# Patient Record
Sex: Female | Born: 1985
Health system: Southern US, Community
[De-identification: ages and names within clinical notes are randomized; demographics above are authoritative.]

## PROBLEM LIST (undated history)

## (undated) DIAGNOSIS — G4700 Insomnia, unspecified: Secondary | ICD-10-CM

## (undated) DIAGNOSIS — F419 Anxiety disorder, unspecified: Secondary | ICD-10-CM

## (undated) DIAGNOSIS — F909 Attention-deficit hyperactivity disorder, unspecified type: Secondary | ICD-10-CM

## (undated) DIAGNOSIS — F319 Bipolar disorder, unspecified: Secondary | ICD-10-CM

## (undated) HISTORY — PX: COLPOSCOPY: SHX161

## (undated) HISTORY — DX: Anxiety disorder, unspecified: F41.9

## (undated) HISTORY — DX: Bipolar disorder, unspecified: F31.9

## (undated) HISTORY — DX: Attention-deficit hyperactivity disorder, unspecified type: F90.9

## (undated) HISTORY — DX: Insomnia, unspecified: G47.00

---

## 2005-05-25 ENCOUNTER — Emergency Department: Payer: Self-pay | Admitting: Emergency Medicine

## 2005-05-27 ENCOUNTER — Emergency Department: Payer: Self-pay | Admitting: Emergency Medicine

## 2005-06-10 ENCOUNTER — Observation Stay: Payer: Self-pay | Admitting: Obstetrics and Gynecology

## 2005-07-22 ENCOUNTER — Observation Stay: Payer: Self-pay | Admitting: Obstetrics and Gynecology

## 2005-08-26 ENCOUNTER — Observation Stay: Payer: Self-pay | Admitting: Obstetrics and Gynecology

## 2005-08-26 ENCOUNTER — Inpatient Hospital Stay: Payer: Self-pay | Admitting: Obstetrics and Gynecology

## 2007-05-03 ENCOUNTER — Encounter: Payer: Self-pay | Admitting: Maternal & Fetal Medicine

## 2007-07-12 ENCOUNTER — Encounter: Payer: Self-pay | Admitting: Maternal & Fetal Medicine

## 2007-08-12 ENCOUNTER — Observation Stay: Payer: Self-pay | Admitting: Obstetrics and Gynecology

## 2007-08-16 ENCOUNTER — Encounter: Payer: Self-pay | Admitting: Obstetrics and Gynecology

## 2007-08-23 ENCOUNTER — Encounter: Payer: Self-pay | Admitting: Maternal & Fetal Medicine

## 2007-08-30 ENCOUNTER — Encounter: Payer: Self-pay | Admitting: Maternal & Fetal Medicine

## 2007-09-06 ENCOUNTER — Encounter: Payer: Self-pay | Admitting: Obstetrics & Gynecology

## 2007-09-10 ENCOUNTER — Encounter: Payer: Self-pay | Admitting: Obstetrics & Gynecology

## 2007-09-17 ENCOUNTER — Encounter: Payer: Self-pay | Admitting: Maternal & Fetal Medicine

## 2007-09-20 ENCOUNTER — Observation Stay: Payer: Self-pay

## 2007-09-24 ENCOUNTER — Observation Stay: Payer: Self-pay

## 2007-09-26 ENCOUNTER — Inpatient Hospital Stay: Payer: Self-pay | Admitting: Obstetrics and Gynecology

## 2009-02-07 IMAGING — US ULTRAOUND OB LIMITED - NRPT [facility]
1 series · 14 of 28 positions shown · non-contrast
Comparison: none

[Series 1: ultraound ob limited - nrpt [facility] · 0.38mm/px · 14 of 42 slices shown]
[im 2/42]
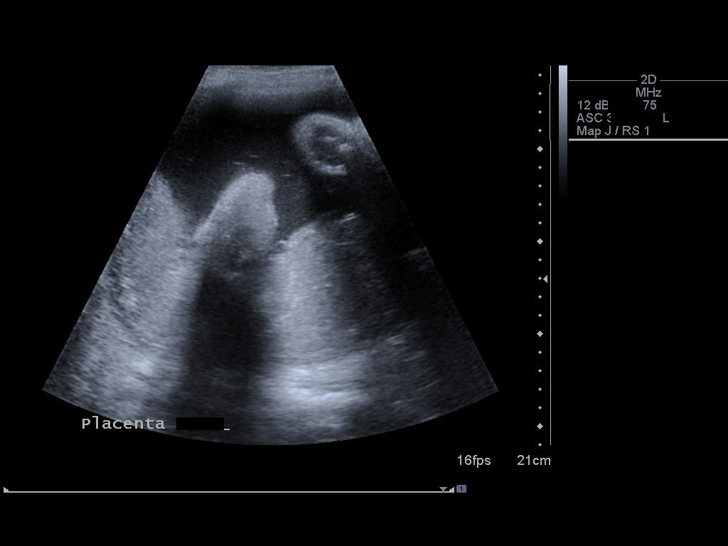
[im 5/42]
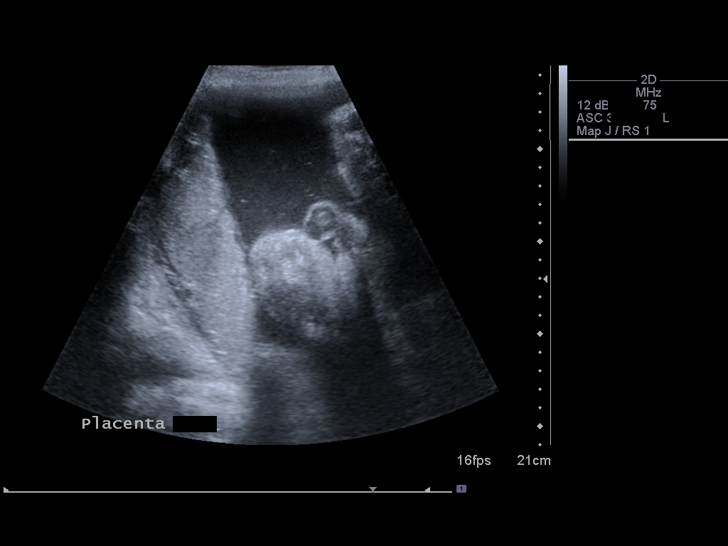
[im 8/42]
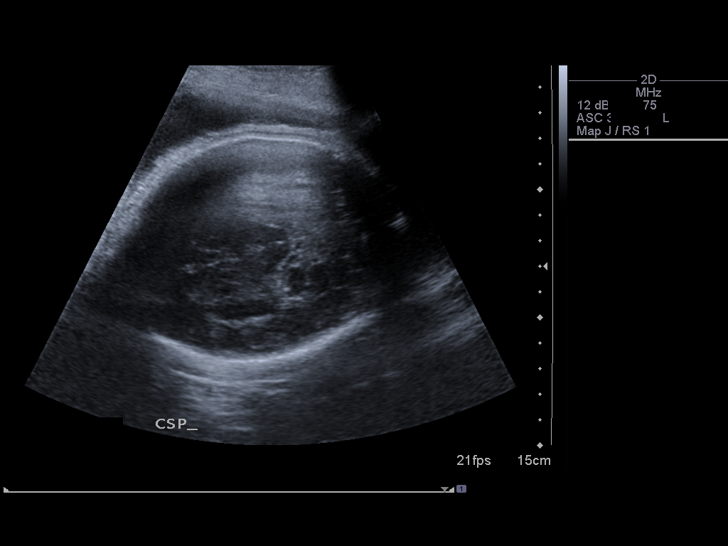
[im 11/42]
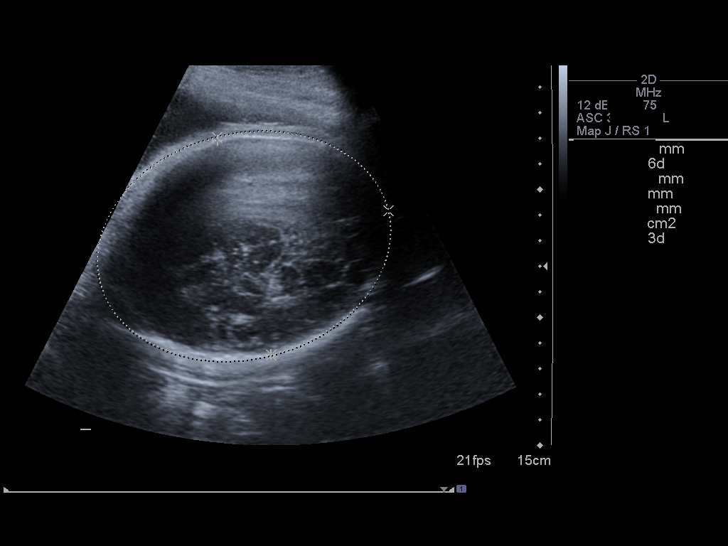
[im 14/42]
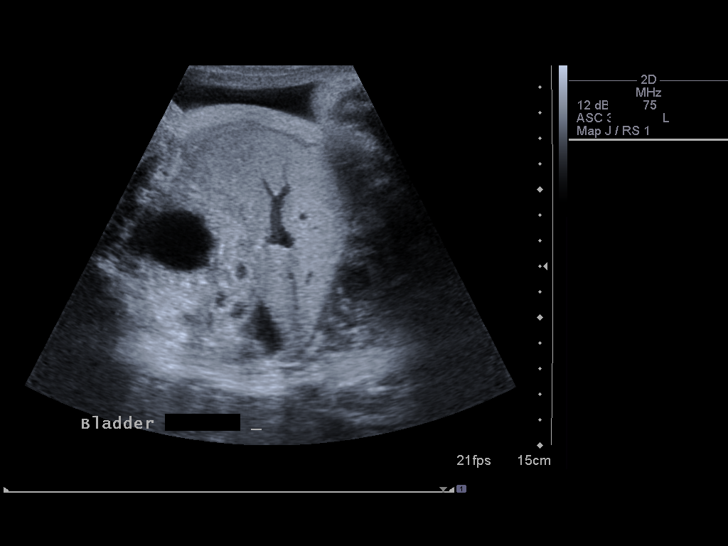
[im 17/42]
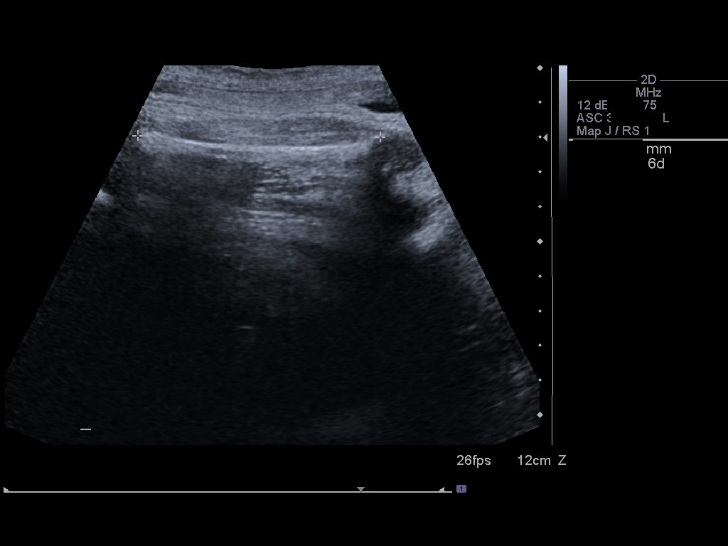
[im 20/42]
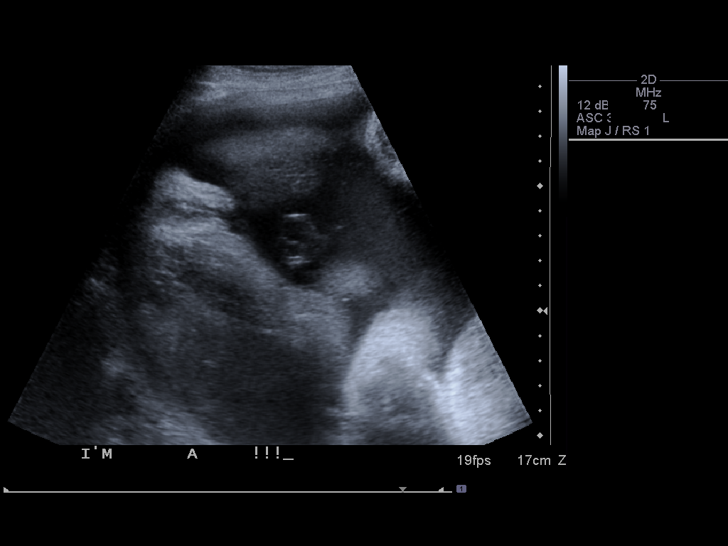
[im 23/42]
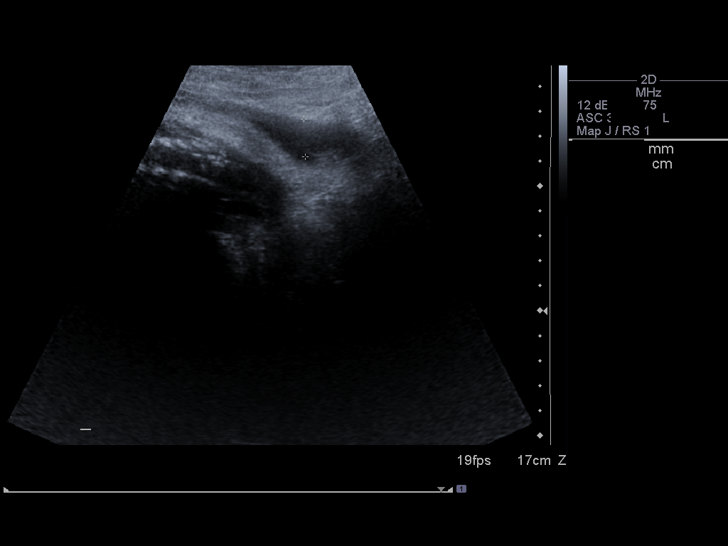
[im 26/42]
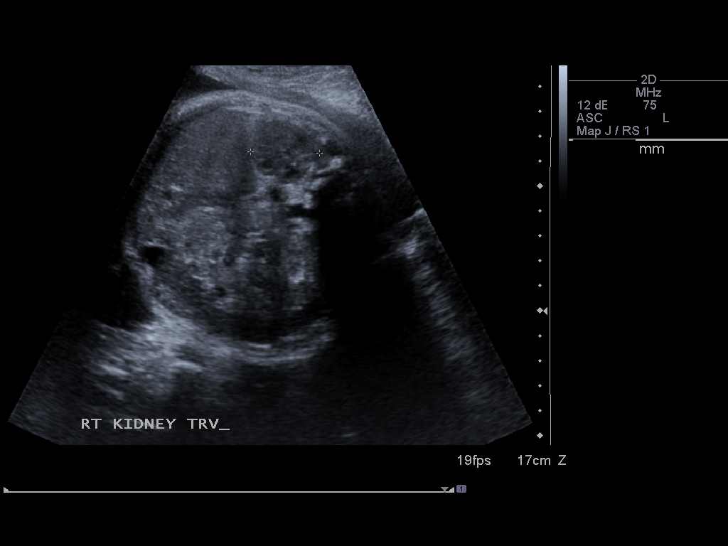
[im 29/42]
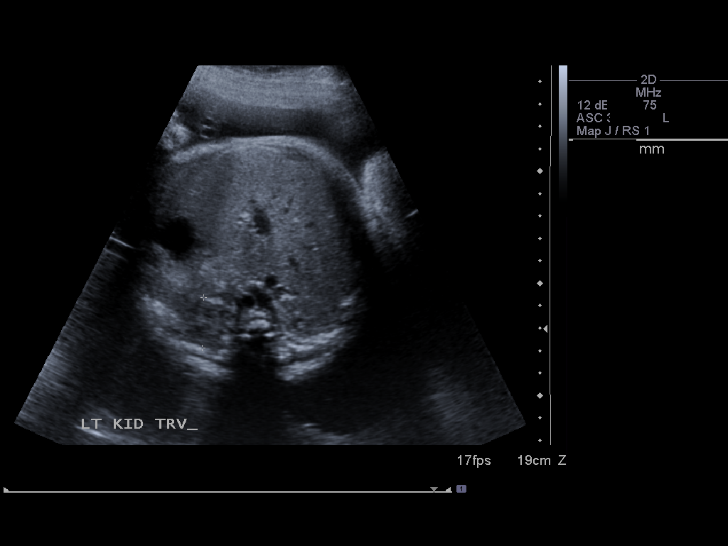
[im 32/42]
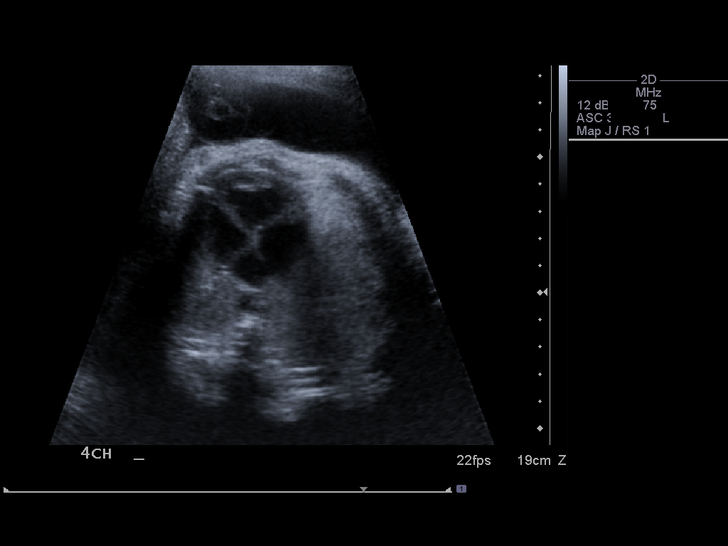
[im 35/42]
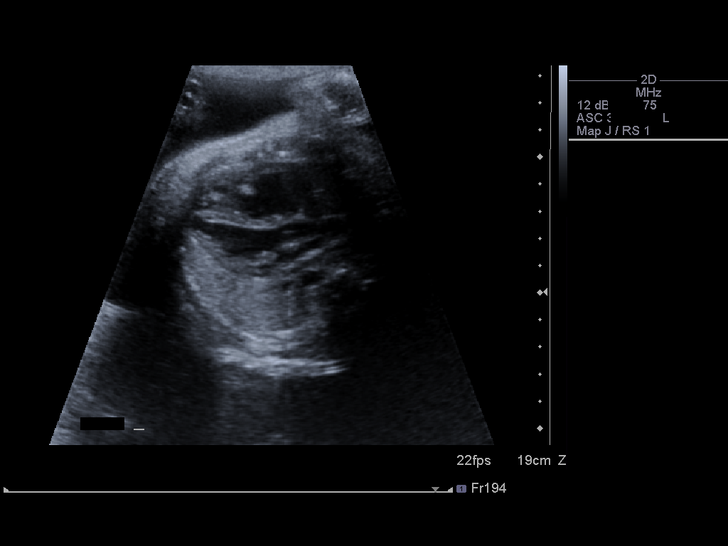
[im 38/42]
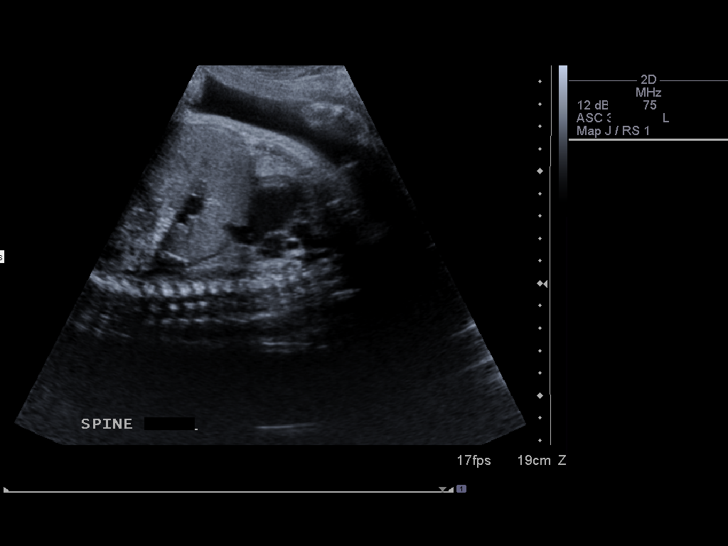
[im 42/42]
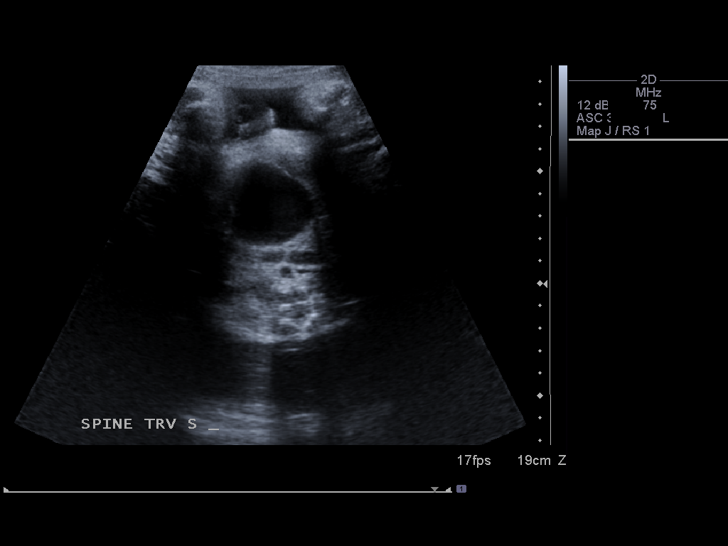

[14 of 28 positions shown; findings below may reference images not displayed]

IMAGES IMPORTED FROM THE SYNGO WORKFLOW SYSTEM
NO DICTATION FOR STUDY

## 2009-02-18 IMAGING — US ULTRAOUND OB LIMITED - NRPT [facility]
1 series · 13 of 13 positions shown · non-contrast
Comparison: none

[Series 1: ultraound ob limited - nrpt [facility] · 0.31mm/px · 13 of 13 slices shown]
[im 1/13]
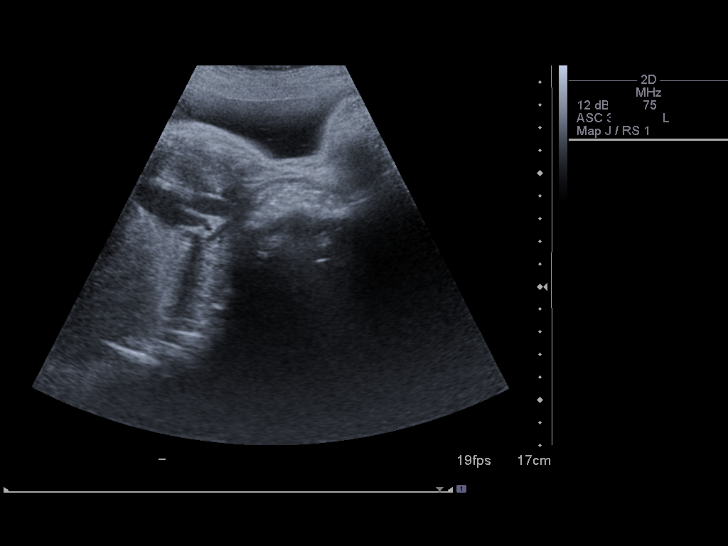
[im 2/13]
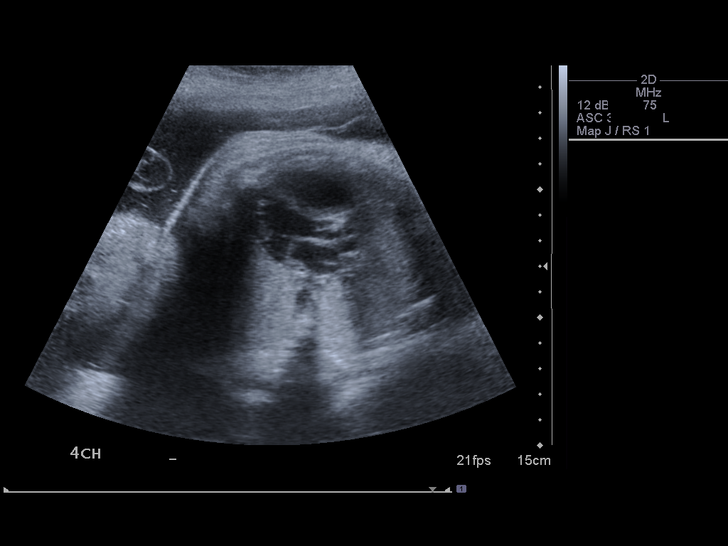
[im 3/13]
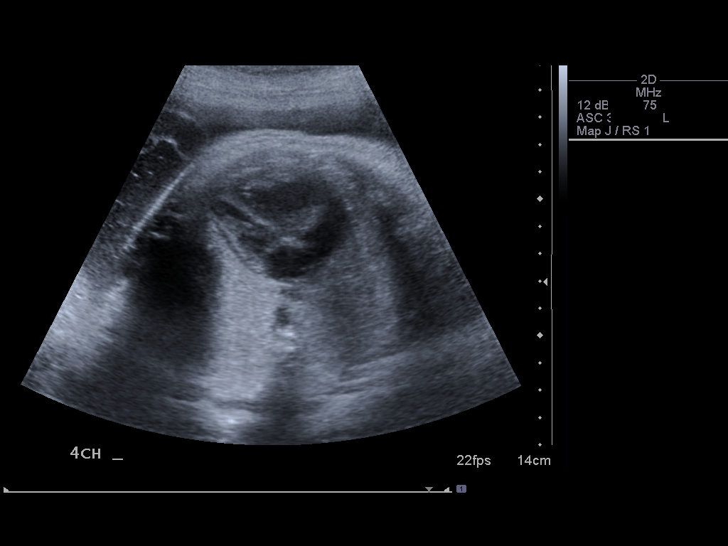
[im 4/13]
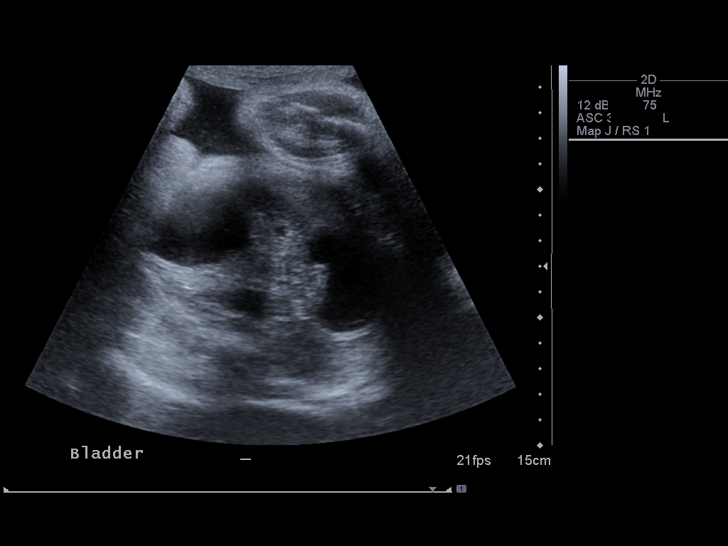
[im 5/13]
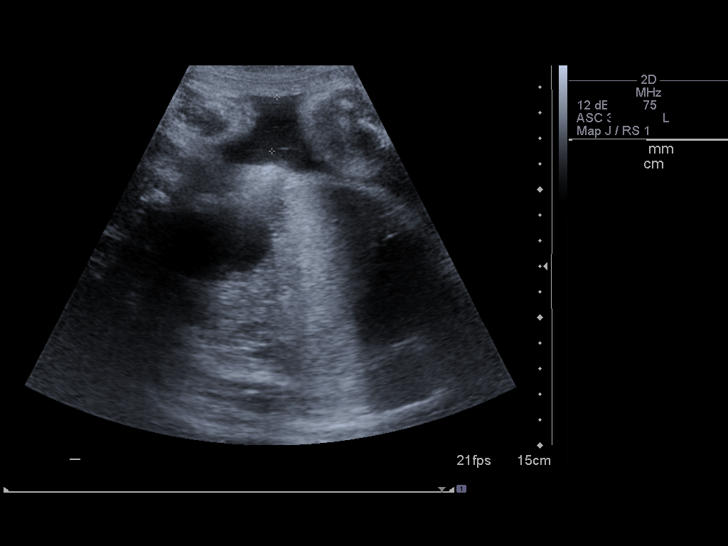
[im 6/13]
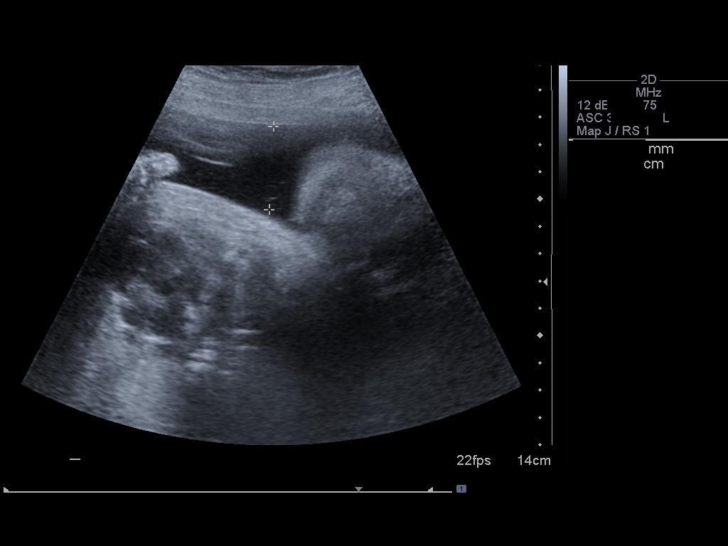
[im 7/13]
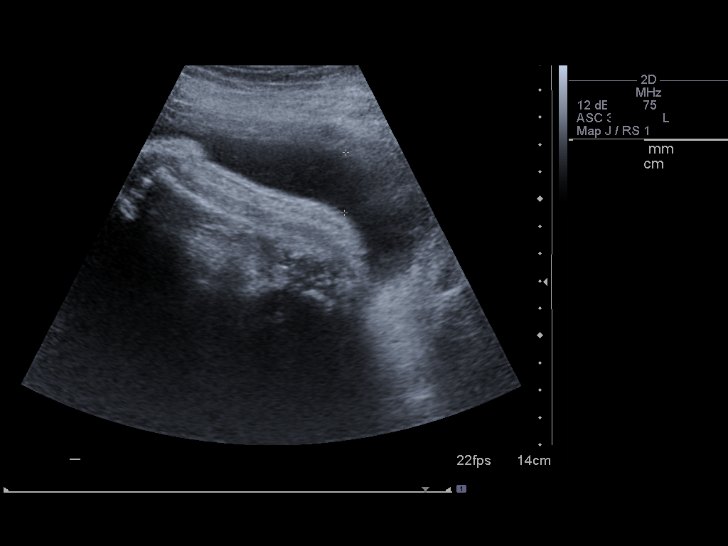
[im 8/13]
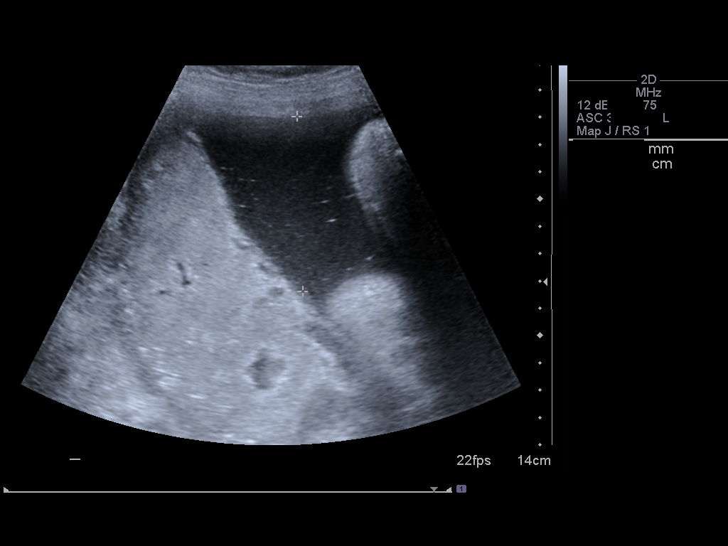
[im 9/13]
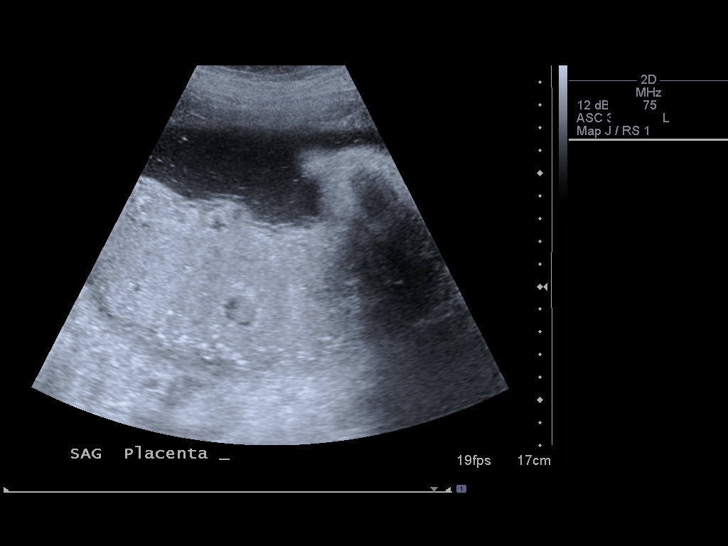
[im 10/13]
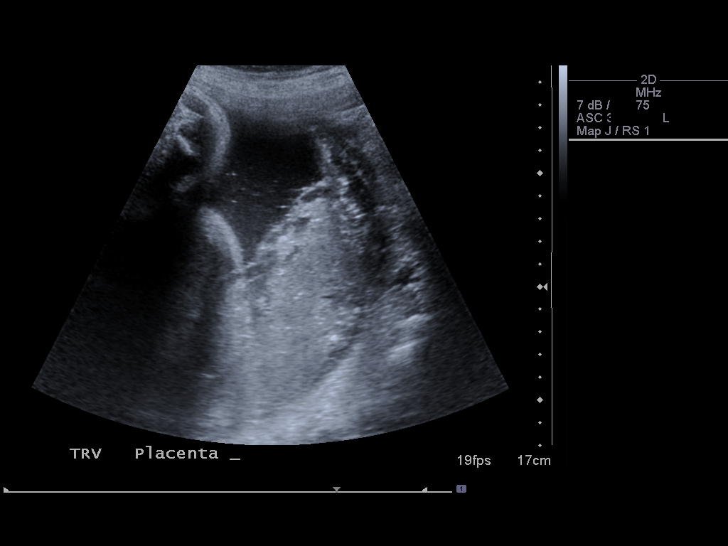
[im 11/13]
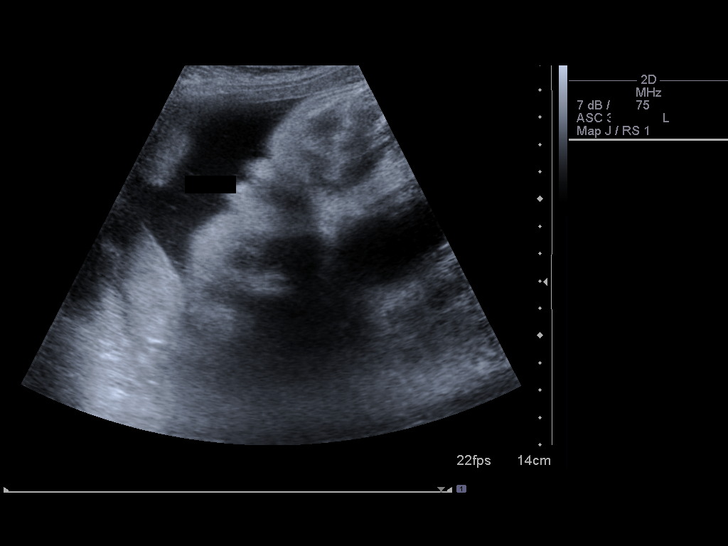
[im 12/13]
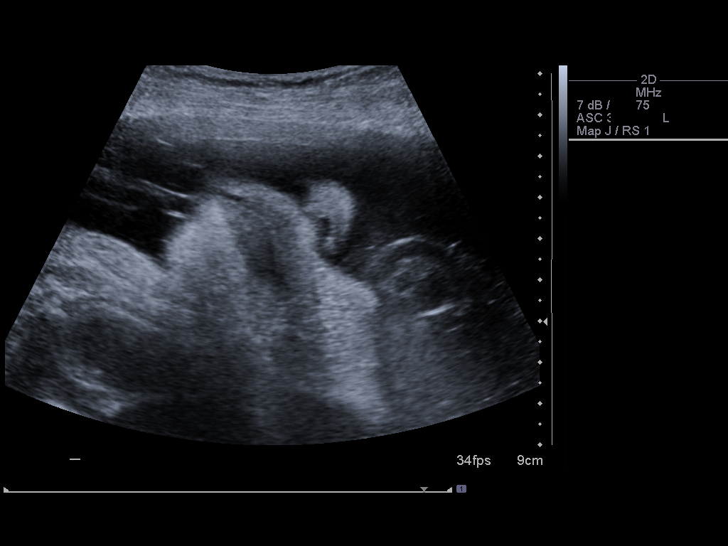
[im 13/13]
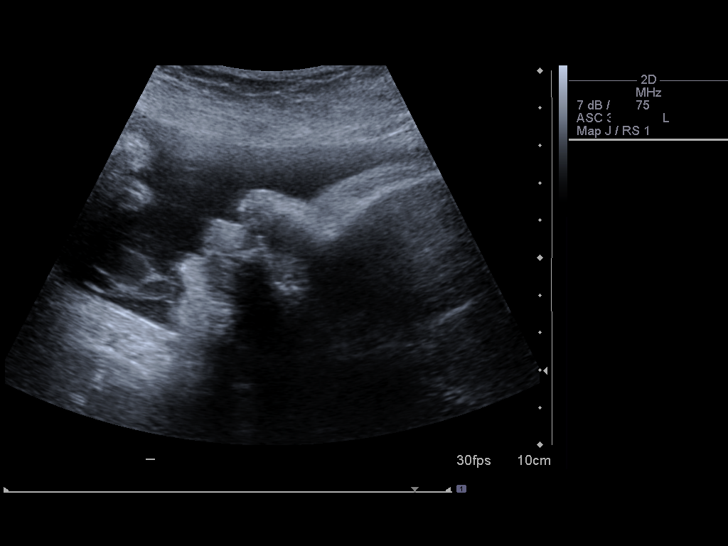

[13 of 13 positions shown; findings below may reference images not displayed]

IMAGES IMPORTED FROM THE SYNGO WORKFLOW SYSTEM
NO DICTATION FOR STUDY

## 2013-01-21 ENCOUNTER — Encounter: Payer: Self-pay | Admitting: Obstetrics and Gynecology

## 2013-02-28 ENCOUNTER — Encounter: Payer: Self-pay | Admitting: Maternal & Fetal Medicine

## 2013-05-09 ENCOUNTER — Encounter: Payer: Self-pay | Admitting: Maternal & Fetal Medicine

## 2013-06-03 ENCOUNTER — Encounter: Payer: Self-pay | Admitting: Maternal & Fetal Medicine

## 2013-06-20 ENCOUNTER — Encounter: Payer: Self-pay | Admitting: Maternal & Fetal Medicine

## 2013-06-27 ENCOUNTER — Encounter: Payer: Self-pay | Admitting: Obstetrics and Gynecology

## 2013-07-01 ENCOUNTER — Encounter: Payer: Self-pay | Admitting: Maternal & Fetal Medicine

## 2013-07-04 ENCOUNTER — Encounter: Payer: Self-pay | Admitting: Obstetrics and Gynecology

## 2013-07-08 ENCOUNTER — Encounter: Payer: Self-pay | Admitting: Obstetrics & Gynecology

## 2013-07-11 ENCOUNTER — Encounter: Payer: Self-pay | Admitting: Maternal & Fetal Medicine

## 2013-07-15 ENCOUNTER — Encounter: Payer: Self-pay | Admitting: Maternal & Fetal Medicine

## 2013-07-18 ENCOUNTER — Encounter: Payer: Self-pay | Admitting: Obstetrics and Gynecology

## 2013-07-21 ENCOUNTER — Inpatient Hospital Stay: Payer: Self-pay

## 2013-07-21 LAB — CBC WITH DIFFERENTIAL/PLATELET
BASOS ABS: 0.1 10*3/uL (ref 0.0–0.1)
BASOS PCT: 0.4 %
EOS PCT: 0.6 %
Eosinophil #: 0.1 10*3/uL (ref 0.0–0.7)
HCT: 34.3 % — ABNORMAL LOW (ref 35.0–47.0)
HGB: 11.9 g/dL — ABNORMAL LOW (ref 12.0–16.0)
Lymphocyte #: 2.4 10*3/uL (ref 1.0–3.6)
Lymphocyte %: 12.8 %
MCH: 35.4 pg — ABNORMAL HIGH (ref 26.0–34.0)
MCHC: 34.8 g/dL (ref 32.0–36.0)
MCV: 102 fL — ABNORMAL HIGH (ref 80–100)
MONO ABS: 1 x10 3/mm — AB (ref 0.2–0.9)
Monocyte %: 5.6 %
Neutrophil #: 15 10*3/uL — ABNORMAL HIGH (ref 1.4–6.5)
Neutrophil %: 80.6 %
PLATELETS: 317 10*3/uL (ref 150–440)
RBC: 3.36 10*6/uL — ABNORMAL LOW (ref 3.80–5.20)
RDW: 13.6 % (ref 11.5–14.5)
WBC: 18.6 10*3/uL — AB (ref 3.6–11.0)

## 2013-07-24 LAB — HEMATOCRIT: HCT: 32.3 % — ABNORMAL LOW (ref 35.0–47.0)

## 2013-11-24 DIAGNOSIS — F3181 Bipolar II disorder: Secondary | ICD-10-CM | POA: Insufficient documentation

## 2014-05-10 NOTE — Consult Note (Signed)
Referral Information:  Reason for Referral 29yo [id number] at 13 weeks 1 day (by LMP consistent with today's ultrasound) who presents for ultrasound, genetic counseling and MFM consult for the indiction of history of a term IUFD in 2007.  Note that she has been seen previously (both by genetics and MFM) for this indication in 2009 during which her history was reviewed, the pregnancy was monitored and she subsequently had a normal term delivery of a healthy girl in September of 2009.   Referring Physician [facility]   Prenatal Hx Planned pregnancy, thus far uncomplicated.  She continues to smoke but has been able to abstain from other substances (MJ) per her report.   Past Obstetrical Hx 2000 - SAB 2002 - SAB 2004 - [facility]product of [facility]) 2007 - IUFD at term, female, 6#6oz (presented after decreased fetal movement) 2009 - NVD at 4774w6d, female, 7#2oz (labor augmented after SROM) 2015 - current - same FOB as 2 prior pregnancies   Home Medications: Medication Instructions Status  Valtrex 1 g oral tablet 1  orally once a day  Active  multivitamin, prenatal 1   once a day  Active   Allergies:   Penicillin: Swelling  Vital Signs/Notes:  Nursing Vital Signs: **Vital Signs.:   05-Jan-15 12:58  Vital Signs Type Routine  Temperature Temperature (F) 98.4  Celsius 36.8  Temperature Source oral  Pulse Pulse 89  Respirations Respirations 18  Systolic BP Systolic BP 123  Diastolic BP (mmHg) Diastolic BP (mmHg) 71  Mean BP 88  Pulse Ox % Pulse Ox % 99  Pulse Ox Activity Level  At rest  Oxygen Delivery Room Air/ 21 %   Perinatal Consult:  LMP 21-Oct-2012   PGyn Hx Remote history of abnormal PAPs; s/p colposcopy but no other treatment   PMed Hx Rubella Immune, Hx of varicella   Past Medical History cont'd 1.  Depression/Bipolar disorder - no medications x 1 year as she's been planning this current pregnancy.  Prior use of  Abilify and Depakote.  [facility] as a teen.  Mood stable since stopping medications - has the contact information for Verta Ellenora Strickland who she used to see at [facility]. 2.  Past history of substance use (including cocaine, marijuana, ETOH); currently smoking 1/2 PPD 3.  History of B12 deficiency (diagnosed prior pregnancy with megaloblastic anemia - elevated MCV and low Hgb, slightly low B12) - MCV on 12/24/12 was 96 (normal) and Hgb was 13.1 (normal). 4.  HSV - last outbreak was 5-6 years ago (only gets outbreaks on back of arms, legs, near her eye - no genital outbreaks per her recollection)   PSurg Hx EAB at 20 weeks   FHx See genetic counseling note for extended family history details   Occupation Mother Not working   Occupation Father Works at [employer]   Soc Hx Married; smoking 1/2 PPD (smoking since age 29); denies current ETOH (last use before September), MJ (last use month prior to pregnancy) or cocaine (last use > 5 years ago)   Review Of Systems:  Subjective Overall feels well; some N/V at night   Fever/Chills No   Cough No   Abdominal Pain No   Diarrhea No   Constipation No   Nausea/Vomiting Yes   SOB/DOE No   Chest Pain No   Dysuria No   Tolerating Diet Yes   Medications/Allergies Reviewed Medications/Allergies reviewed    Additional Lab/Radiology Notes  Review of the results of the evaluation of the IUFD in 2007: Normal karyotype (X,X), autopsy was normal, appropriate growth, no evidence of preeclampsia, TSH, Anticardiolipin Ab, Lupus Anticoagulant, Beta 2 microglobulin, Antithrombin III, Protein C and S and Factor V Leiden were normal, Parvovirus B19 titer was negative, RPR was nonreactive.  Placental pathology was negative with the exception of moderate amount of adherant blood clot (no clinical evidence of abruption): "Gross description:                                              . Note: Placenta corresponds to fetal  autopsy A3-07.  Received in a formalin filled container labeled Ahliyah Nienow is a placental disc with attached length of umbilical cord and membrane.  The placental disc alone weighs 540 grams and measures 17.0 x 15.0 x 2.5 cm.  The fetal surface is grey blue with a small amount of thickening noted under the fetal membranes.  The maternal surface is intact with a minimal amount of adherent blood clot.  Multiple sections through the placental parenchyma reveal a dark red spongy appearance with no evidence of calcification or nodularities noted.  The umbilical cord is eccentrically located 5.5 cm away from the nearest edge.  It measures 30.0 x 2.5 cm in diameter, is markedly discolored purple and appears congested. On sectioning, the umbilical cord shows three blood vessels.  The membranes are grey tan, slightly cloudy, and have a moderate amount of adherent blood clot."   Impression/Recommendations:  Impression 29yo [id number] at 13 weeks 1 day (by LMP consistent with today's ultrasound) with history of IUFD at term with no certain etiology. Subsequent pregnancy was monitored with serial utrasounds for growth after 28 weeks, weekly antenatal testing starting at 32 weeks and increasing to twice weekly at 36 weeks and delivery was recommended by 39 weeks (earlier with evidence of fetal lung maturity by amniocentesis).  She delivered 1 day shy of 39 weeks; she broke her water and labor was augmented.  Also with history of B12 deficiency, on PNVs only (MCV and Hgb were normal in December).   Recommendations Recommend similar management this pregnancy. First trimester screen performed today (we will follow up on results). Ultrasound for fetal anatomy at 18 weeks.  Monthly ultrasounds for growth after 28 weeks.  Antenatal testing to start at 32 weeks.  Delivery by 39 weeks. Smoking cessation strongly encouraged (was the only significant, modifiable risk factor associated with her prior term  demise). Continue PNVs. Support mood as needed. Could consider Valtrex prophylaxis starting at 36 weeks although patient denies ever having genital outbreaks.   Plan:  Genetic Counseling yes   Prenatal Diagnosis Options First trimester   Ultrasound at what gestational ages Monthly > 28 weeks   Antepartum Testing Starting at 32 weeks   Additional Testing Folate/prenatal vitamins    Total Time Spent with Patient 15 minutes   >50% of visit spent in couseling/coordination of care yes   Office Use Only [address]  Level 1 ( ) NEW office consult prob focused   Coding Description: MATERNAL CONDITIONS/HISTORY INDICATION(S).   Previous stillborn or neonatal death - Poor Reproductive History.  Electronic Signatures: Kirby Funk (MD)  (Signed 05-Jan-15 15:04)  Authored: Referral, Home Medications, Allergies, Vital Signs/Notes, Consult, Exam, Lab/Radiology Notes, Impression, Plan, Billing, Coding Description   Last Updated: 05-Jan-15 15:04 by Kirby Funk (MD)

## 2014-06-25 IMAGING — US US OB NUCHAL TRANSLUCENCY 1ST GEST
1 series · 14 of 28 positions shown · non-contrast
Comparison: none

[Series 1: us ob nuchal translucency 1st gest · 0.20mm/px · 14 of 37 slices shown]
[im 2/37]
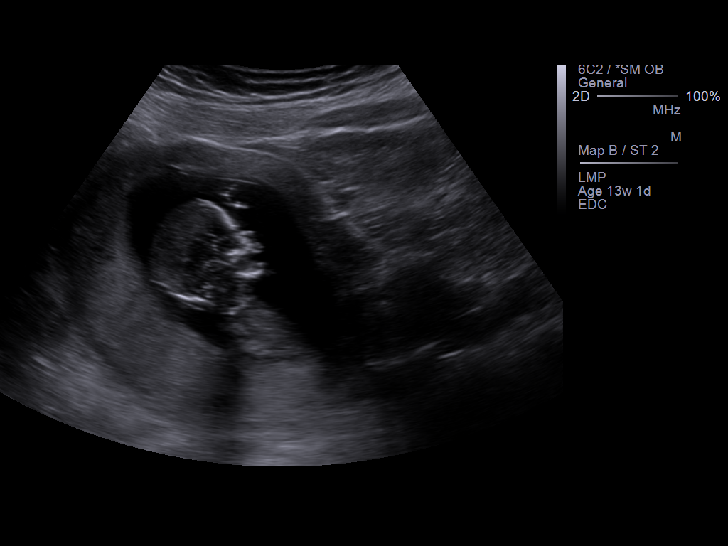
[im 5/37]
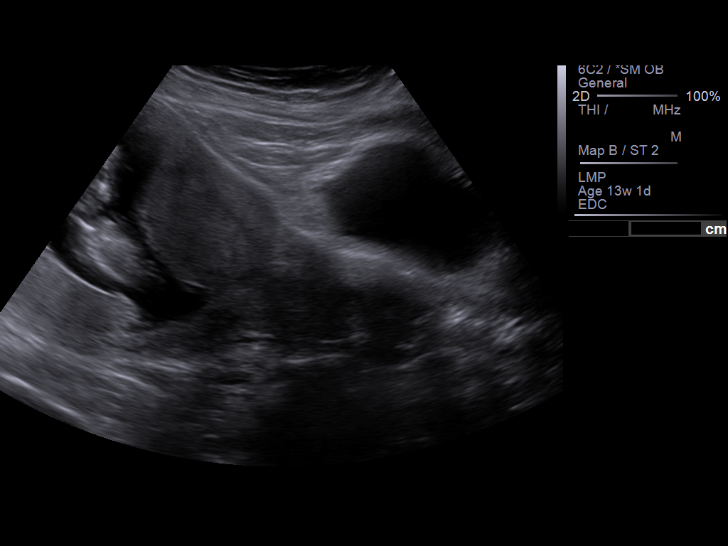
[im 7/37]
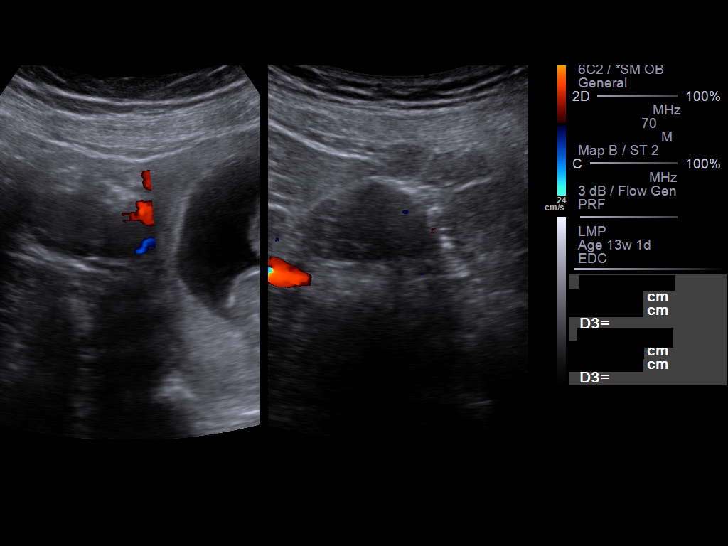
[im 10/37]
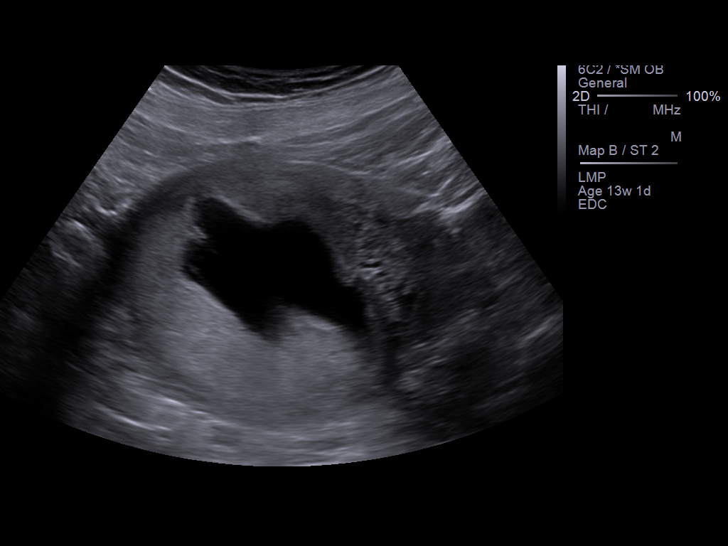
[im 13/37]
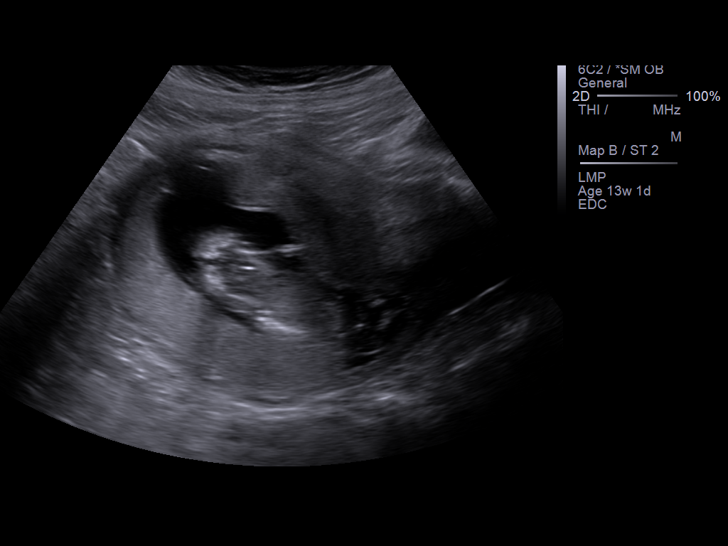
[im 15/37]
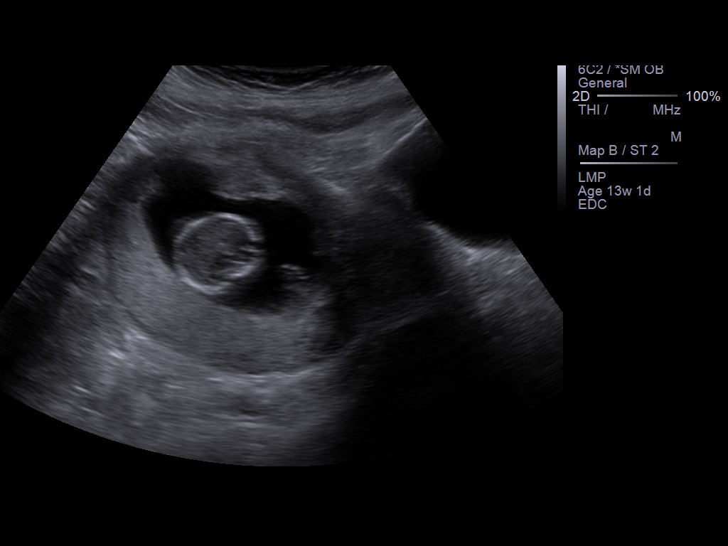
[im 18/37]
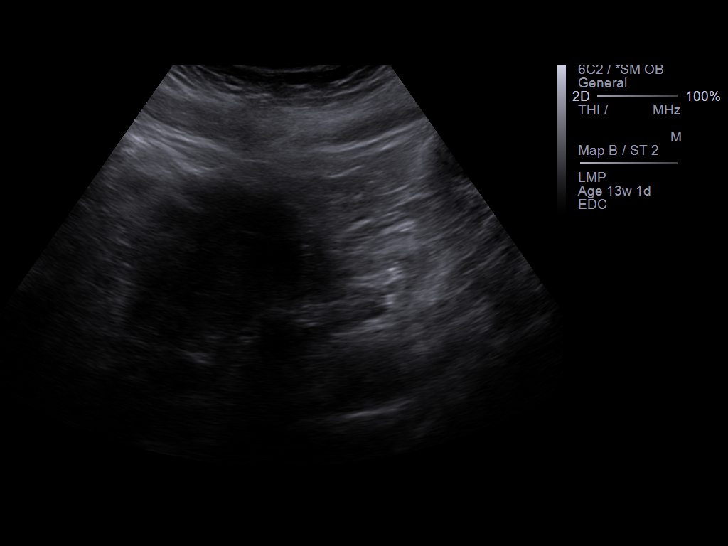
[im 21/37]
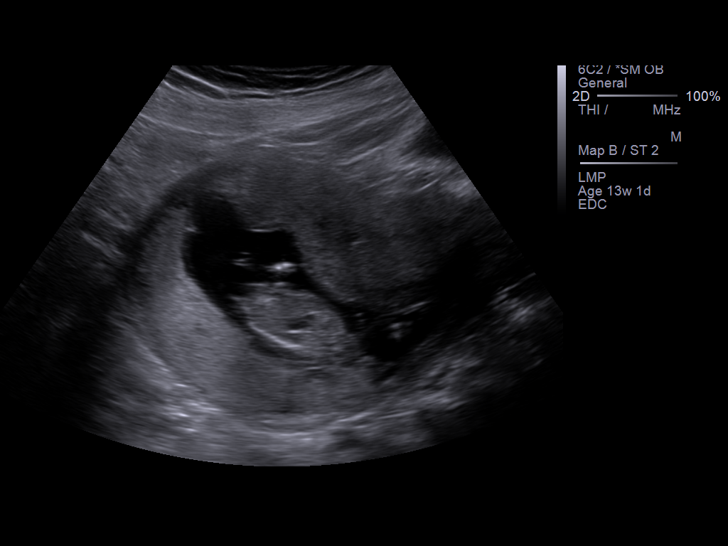
[im 23/37]
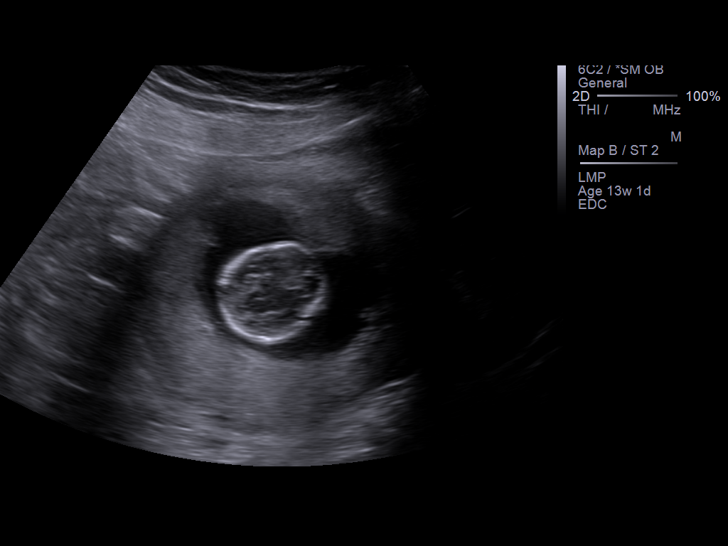
[im 26/37]
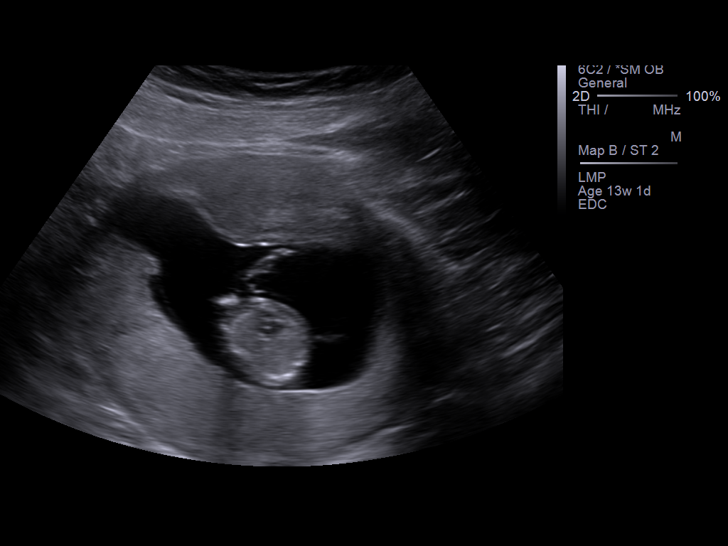
[im 29/37]
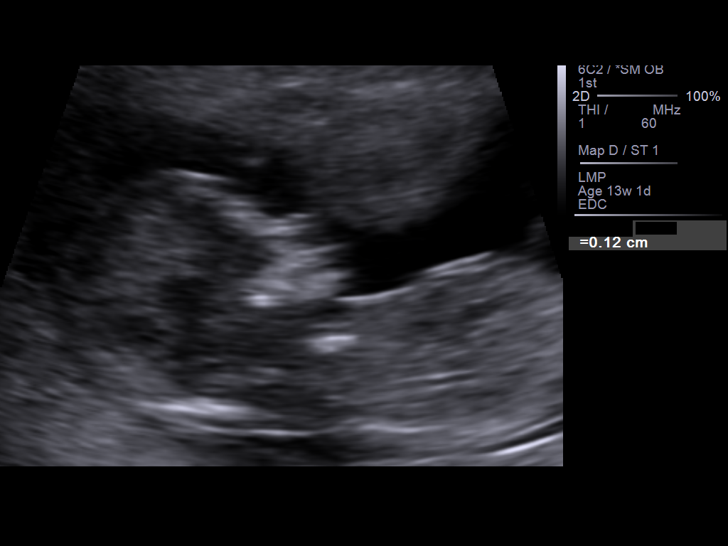
[im 31/37]
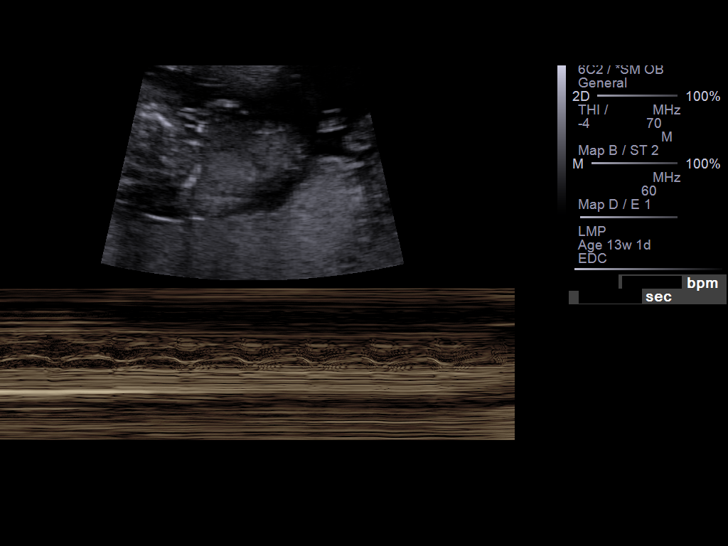
[im 34/37]
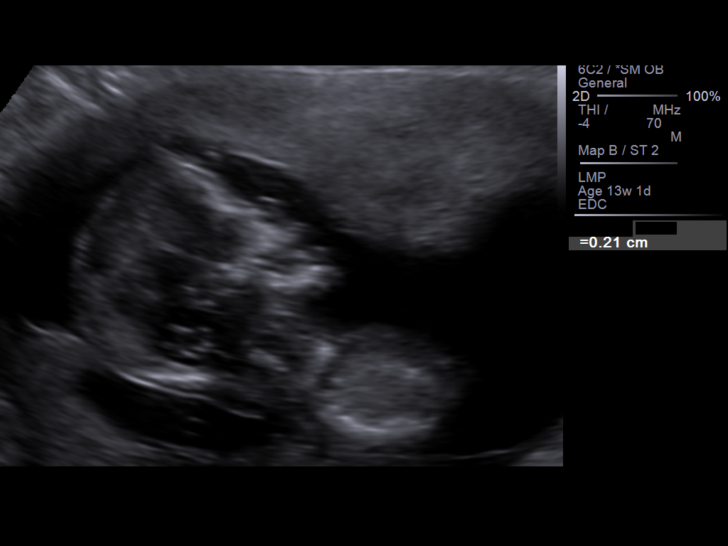
[im 37/37]
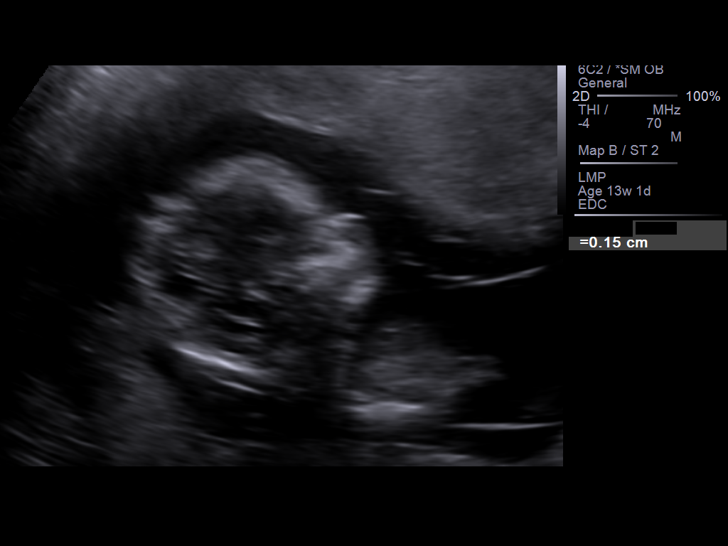

[14 of 28 positions shown; findings below may reference images not displayed]

IMAGES IMPORTED FROM THE SYNGO WORKFLOW SYSTEM
NO DICTATION FOR STUDY

## 2014-08-02 IMAGING — US US OB DETAIL+14 WK - NRPT [facility]
1 series · 14 of 28 positions shown · non-contrast
Comparison: none

[Series 1: us ob detail+14 wk - nrpt [facility] · 0.25mm/px · 14 of 94 slices shown]
[im 4/94]
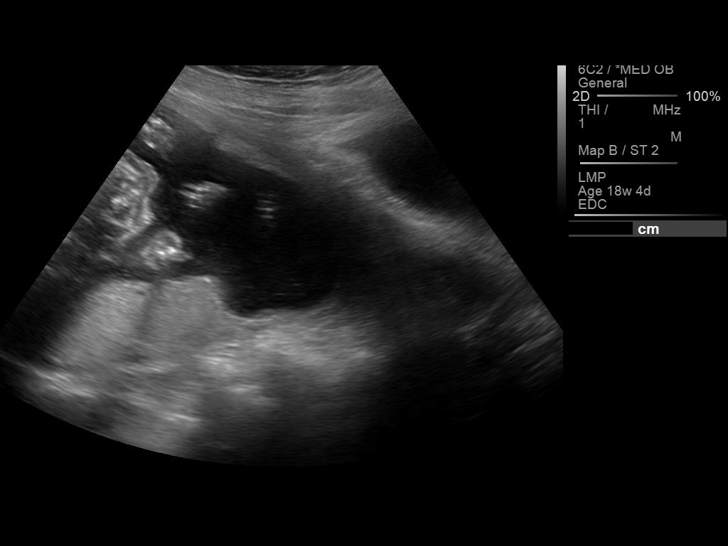
[im 11/94]
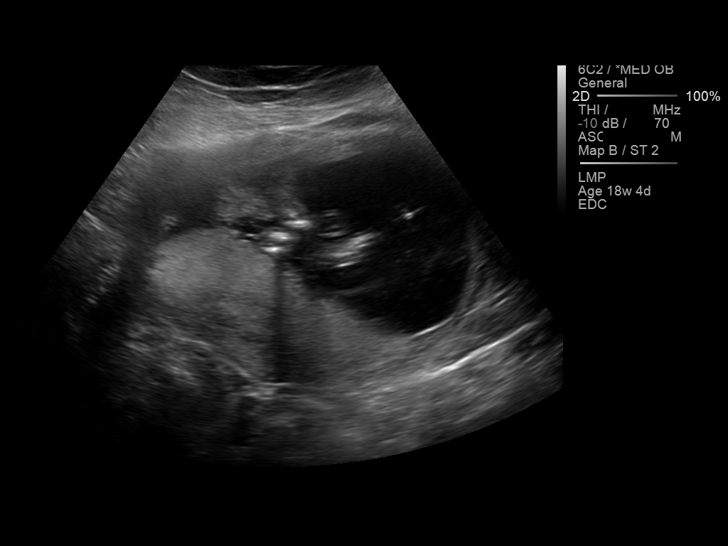
[im 18/94]
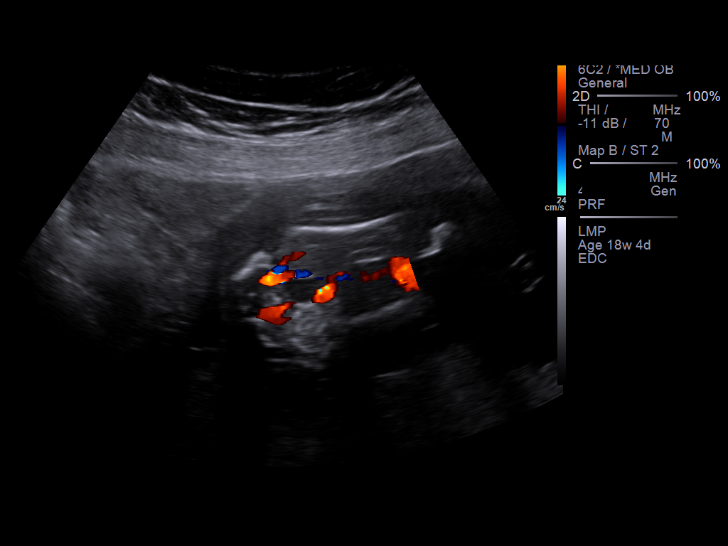
[im 25/94]
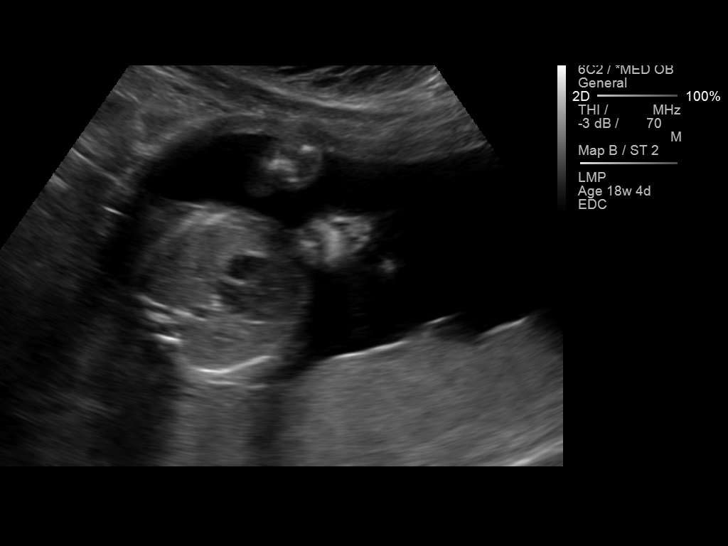
[im 32/94]
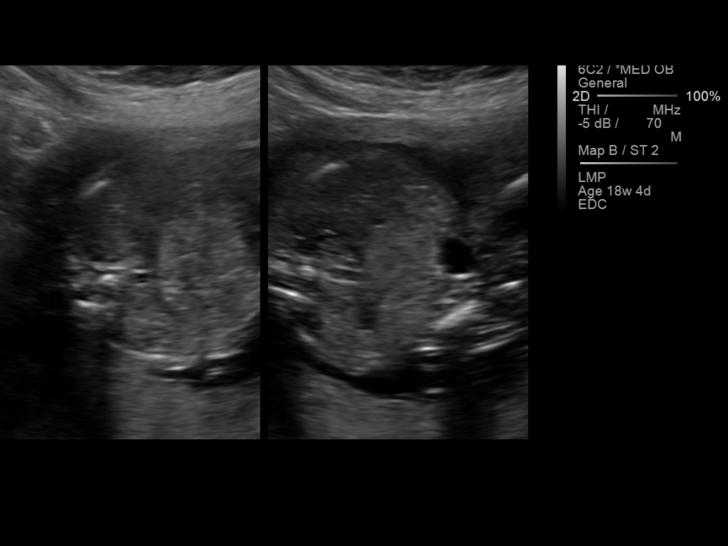
[im 38/94]
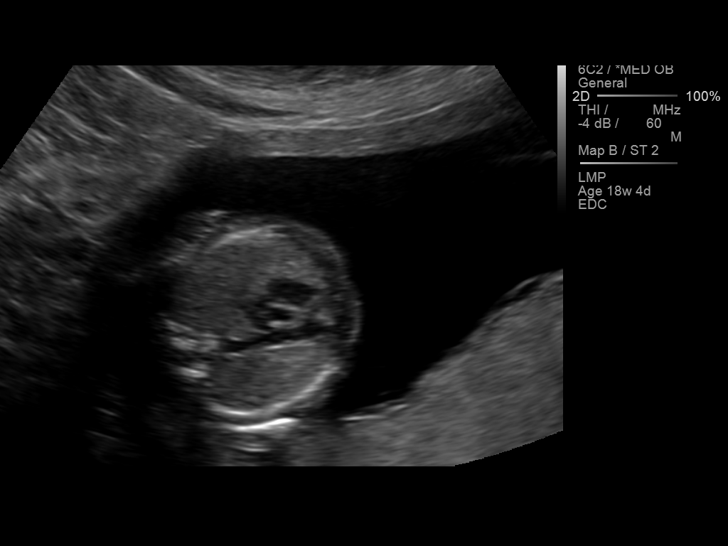
[im 45/94]
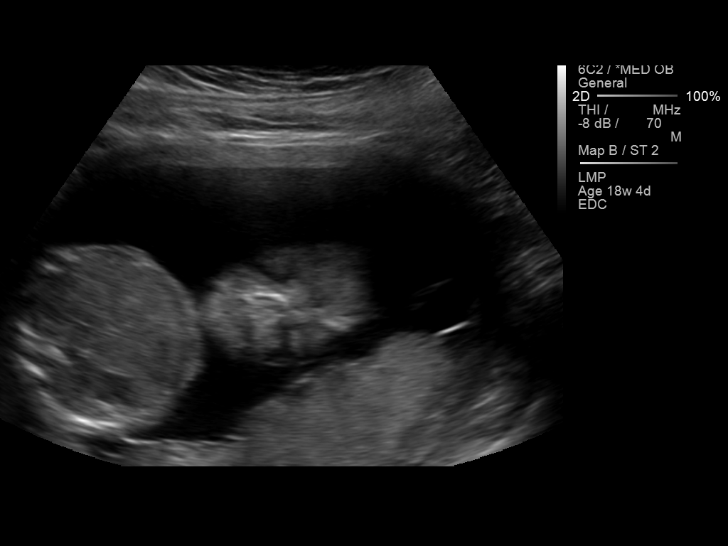
[im 52/94]
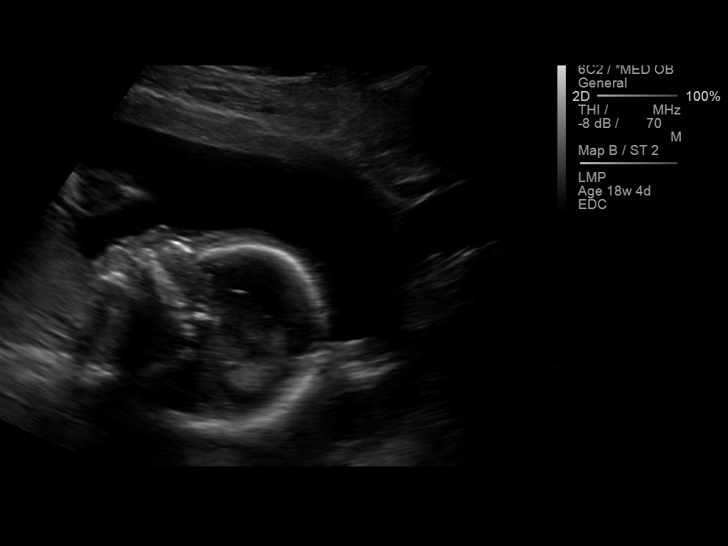
[im 59/94]
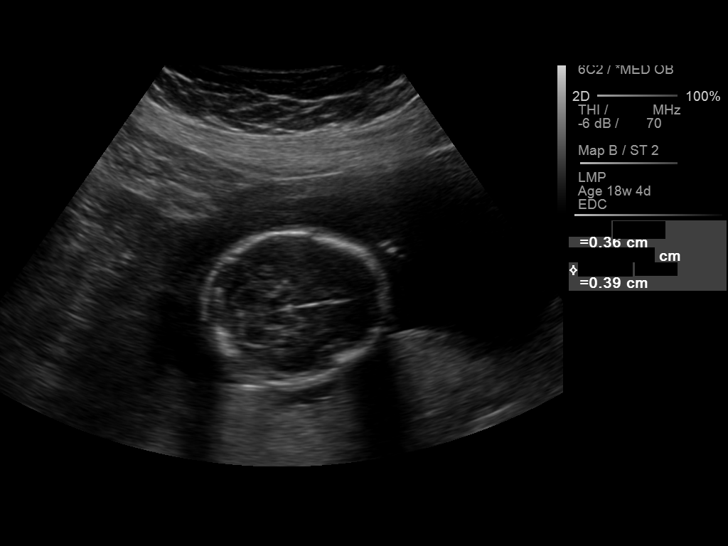
[im 66/94]
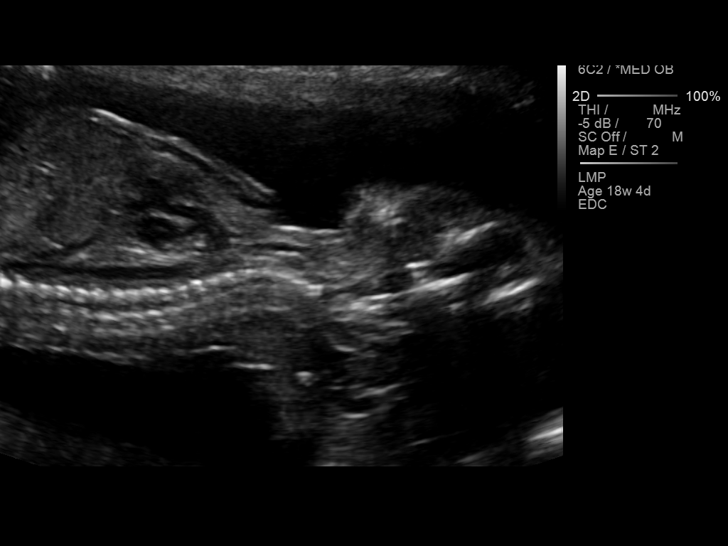
[im 73/94]
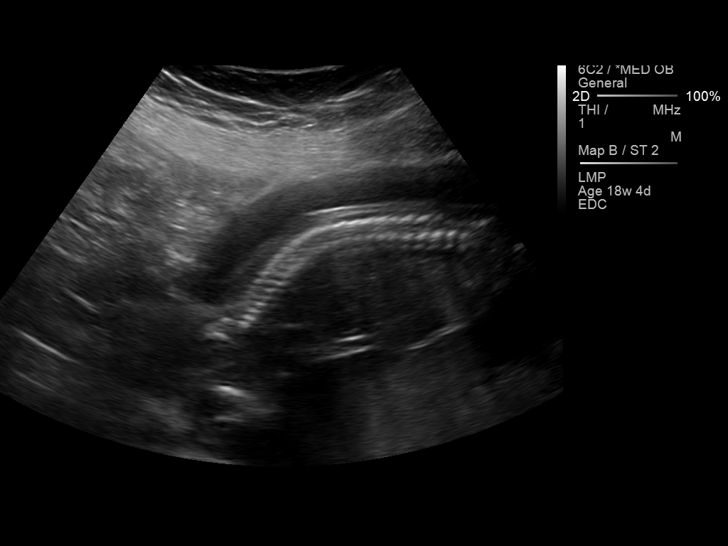
[im 80/94]
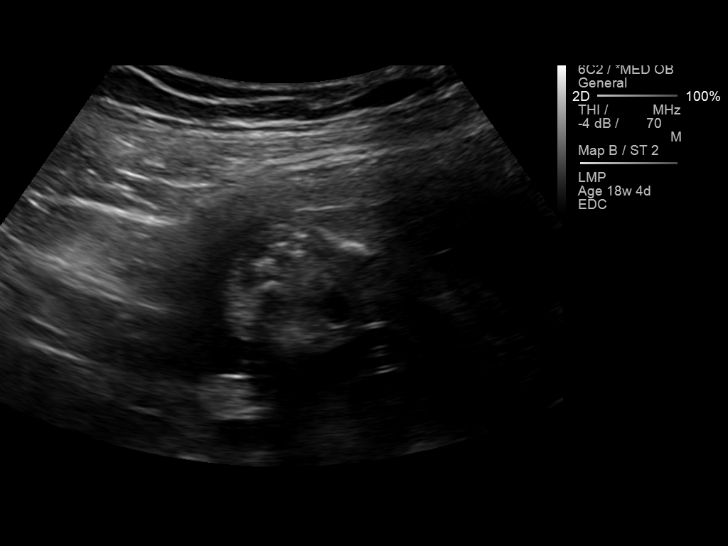
[im 87/94]
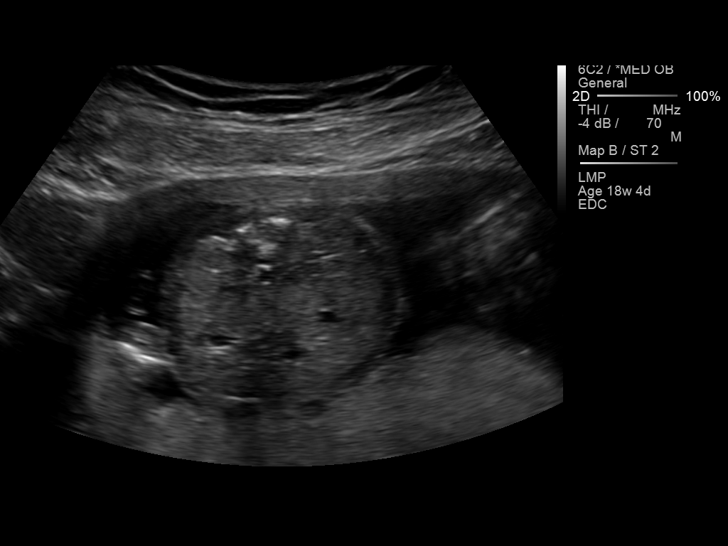
[im 94/94]
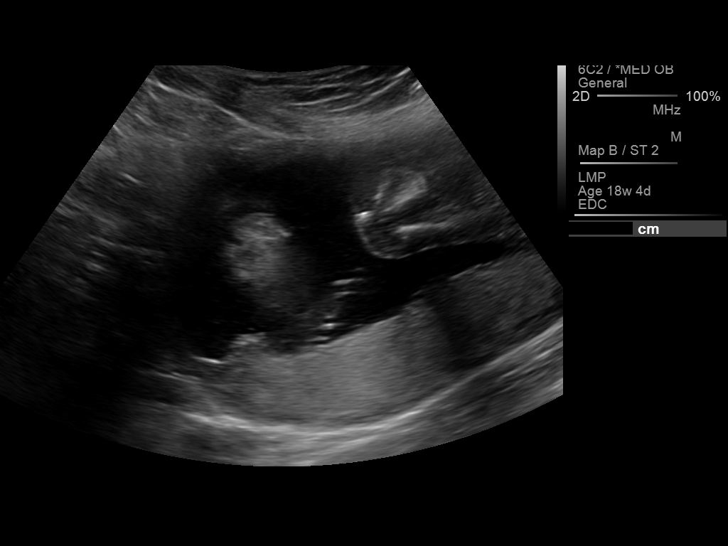

[14 of 28 positions shown; findings below may reference images not displayed]

IMAGES IMPORTED FROM THE SYNGO WORKFLOW SYSTEM
NO DICTATION FOR STUDY

## 2015-08-07 ENCOUNTER — Encounter: Payer: Self-pay | Admitting: Family Medicine

## 2015-08-07 ENCOUNTER — Ambulatory Visit (INDEPENDENT_AMBULATORY_CARE_PROVIDER_SITE_OTHER): Payer: Managed Care, Other (non HMO) | Admitting: Family Medicine

## 2015-08-07 VITALS — BP 124/78 | HR 96 | Resp 16 | Ht 63.0 in | Wt 172.0 lb

## 2015-08-07 DIAGNOSIS — F329 Major depressive disorder, single episode, unspecified: Secondary | ICD-10-CM

## 2015-08-07 DIAGNOSIS — Z7189 Other specified counseling: Secondary | ICD-10-CM | POA: Diagnosis not present

## 2015-08-07 DIAGNOSIS — D531 Other megaloblastic anemias, not elsewhere classified: Secondary | ICD-10-CM

## 2015-08-07 DIAGNOSIS — Z720 Tobacco use: Secondary | ICD-10-CM

## 2015-08-07 DIAGNOSIS — R635 Abnormal weight gain: Secondary | ICD-10-CM | POA: Diagnosis not present

## 2015-08-07 DIAGNOSIS — R5383 Other fatigue: Secondary | ICD-10-CM | POA: Diagnosis not present

## 2015-08-07 DIAGNOSIS — F32A Depression, unspecified: Secondary | ICD-10-CM

## 2015-08-07 DIAGNOSIS — F17200 Nicotine dependence, unspecified, uncomplicated: Secondary | ICD-10-CM

## 2015-08-07 DIAGNOSIS — Z7689 Persons encountering health services in other specified circumstances: Secondary | ICD-10-CM

## 2015-08-07 NOTE — Progress Notes (Signed)
Name: Emma Phillips   MRN: [id number]    DOB: [birth year]   Date:08/07/2015       Progress Note  Subjective  Chief Complaint  Chief Complaint  Patient presents with  . Establish Care    Patient has been having some mood swings.Has had trouble concentrating and also has excessive sweating and loss of hair as well as weight gain.     Depression        This is a chronic problem.  The current episode started 1 to 4 weeks ago.   The onset quality is gradual.   The problem occurs daily.  The problem has been waxing and waning since onset.  Associated symptoms include decreased concentration, fatigue, insomnia and decreased interest.  Associated symptoms include no helplessness, no hopelessness, not irritable, no restlessness, no appetite change, no body aches, no myalgias, no headaches, no indigestion, not sad and no suicidal ideas.     The symptoms are aggravated by family issues (home stress).  Past treatments include other medications (depakote/ abilify).  Past medical history includes thyroid problem.   Thyroid Problem Symptoms include cold intolerance, depressed mood, diaphoresis, fatigue, hair loss and weight gain. Patient reports no anxiety, constipation, diarrhea, hoarse voice, menstrual problem, nail problem, palpitations or weight loss. (Health dept hx colposcopy) The symptoms have been worsening. The treatment provided mild relief. Her past medical history is significant for obesity. There is no history of atrial fibrillation, dementia or heart failure.    No problem-specific assessment & plan notes found for this encounter.   Past Medical History  Diagnosis Date  . Insomnia     History reviewed. No pertinent past surgical history.  Family History  Problem Relation Age of Onset  . Thyroid disease Mother   . Breast cancer Paternal Grandmother     Social History   Social History  . Marital Status: Married    Spouse Name: N/A  . Number of Children: N/A  . Years of  Education: N/A   Occupational History  . Not on file.   Social History Main Topics  . Smoking status: Current Every Day Smoker    Types: Cigarettes  . Smokeless tobacco: Not on file  . Alcohol Use: No  . Drug Use: No  . Sexual Activity: Not on file   Other Topics Concern  . Not on file   Social History Narrative  . No narrative on file    No Known Allergies   Review of Systems  Constitutional: Positive for weight gain, diaphoresis and fatigue. Negative for fever, chills, weight loss, malaise/fatigue and appetite change.  HENT: Negative for ear discharge, ear pain, hoarse voice and sore throat.   Eyes: Negative for blurred vision.  Respiratory: Negative for cough, sputum production, shortness of breath and wheezing.   Cardiovascular: Negative for chest pain, palpitations and leg swelling.  Gastrointestinal: Negative for heartburn, nausea, abdominal pain, diarrhea, constipation, blood in stool and melena.  Genitourinary: Negative for dysuria, urgency, frequency, hematuria and menstrual problem.  Musculoskeletal: Negative for myalgias, back pain, joint pain and neck pain.  Skin: Negative for rash.  Neurological: Negative for dizziness, tingling, sensory change, focal weakness and headaches.  Endo/Heme/Allergies: Positive for cold intolerance. Negative for environmental allergies and polydipsia. Does not bruise/bleed easily.  Psychiatric/Behavioral: Positive for decreased concentration. Negative for depression and suicidal ideas. The patient has insomnia. The patient is not nervous/anxious.      Objective  Filed Vitals:   08/07/15 1511  BP: 124/78  Pulse: 96  Resp:  16  Height: 5\' 3"  (1.6 m)  Weight: 172 lb (78.019 kg)  SpO2: 97%    Physical Exam  Constitutional: She is well-developed, well-nourished, and in no distress. She is not irritable. No distress.  HENT:  Head: Normocephalic and atraumatic.  Right Ear: External ear normal.  Left Ear: External ear normal.   Nose: Nose normal.  Mouth/Throat: Oropharynx is clear and moist.  Eyes: Conjunctivae and EOM are normal. Pupils are equal, round, and reactive to light. Right eye exhibits no discharge. Left eye exhibits no discharge.  Neck: Normal range of motion. Neck supple. No JVD present. No thyromegaly present.  Cardiovascular: Normal rate, regular rhythm, normal heart sounds and intact distal pulses.  Exam reveals no gallop and no friction rub.   No murmur heard. Pulmonary/Chest: Effort normal and breath sounds normal.  Abdominal: Soft. Bowel sounds are normal. She exhibits no mass. There is no tenderness. There is no guarding.  Musculoskeletal: Normal range of motion. She exhibits no edema.  Lymphadenopathy:    She has no cervical adenopathy.  Neurological: She is alert. She has normal reflexes.  Skin: Skin is warm and dry. She is not diaphoretic.  Psychiatric: Mood and affect normal.  Nursing note and vitals reviewed.     Assessment & Plan  Problem List Items Addressed This Visit    None    Visit Diagnoses    Encounter to establish care with new doctor    -  Primary    Smoker        Relevant Orders    Nurse to provide smoking / tobacco cessation education (Completed)    Other fatigue        Relevant Orders    Thyroid Panel With TSH    CBC    Renal Function Panel    Weight gain        Depression        Megaloblastic anemia due to vitamin B12 deficiency        Relevant Orders    CBC         Dr. Elizabeth Sauereanna Tiea Manninen [facility]  08/07/2015

## 2015-08-08 LAB — RENAL FUNCTION PANEL
Albumin: 4.8 g/dL (ref 3.5–5.5)
BUN/Creatinine Ratio: 19 (ref 9–23)
BUN: 11 mg/dL (ref 6–20)
CALCIUM: 9.8 mg/dL (ref 8.7–10.2)
CHLORIDE: 102 mmol/L (ref 96–106)
CO2: 21 mmol/L (ref 18–29)
Creatinine, Ser: 0.57 mg/dL (ref 0.57–1.00)
GFR calc Af Amer: 143 mL/min/{1.73_m2} (ref >59)
GFR, EST NON AFRICAN AMERICAN: 124 mL/min/{1.73_m2} (ref >59)
GLUCOSE: 82 mg/dL (ref 65–99)
PHOSPHORUS: 3.6 mg/dL (ref 2.5–4.5)
POTASSIUM: 4 mmol/L (ref 3.5–5.2)
SODIUM: 140 mmol/L (ref 134–144)

## 2015-08-08 LAB — CBC
HEMOGLOBIN: 15 g/dL (ref 11.1–15.9)
Hematocrit: 43 % (ref 34.0–46.6)
MCH: 35 pg — ABNORMAL HIGH (ref 26.6–33.0)
MCHC: 34.9 g/dL (ref 31.5–35.7)
MCV: 101 fL — ABNORMAL HIGH (ref 79–97)
PLATELETS: 265 10*3/uL (ref 150–379)
RBC: 4.28 x10E6/uL (ref 3.77–5.28)
RDW: 12.6 % (ref 12.3–15.4)
WBC: 7.8 10*3/uL (ref 3.4–10.8)

## 2015-08-08 LAB — THYROID PANEL WITH TSH
Free Thyroxine Index: 2 (ref 1.2–4.9)
T3 UPTAKE RATIO: 28 % (ref 24–39)
T4 TOTAL: 7.2 ug/dL (ref 4.5–12.0)
TSH: 2.08 u[IU]/mL (ref 0.450–4.500)

## 2016-01-18 HISTORY — PX: INTRAUTERINE DEVICE (IUD) INSERTION: SHX5877

## 2017-08-22 ENCOUNTER — Telehealth: Payer: Self-pay | Admitting: Obstetrics and Gynecology

## 2017-08-22 NOTE — Telephone Encounter (Signed)
Lm for pt to call office back. Referred by [facility] for Colpo.

## 2017-08-29 ENCOUNTER — Ambulatory Visit (INDEPENDENT_AMBULATORY_CARE_PROVIDER_SITE_OTHER): Payer: Commercial Managed Care - PPO | Admitting: Obstetrics and Gynecology

## 2017-08-29 ENCOUNTER — Ambulatory Visit: Payer: Self-pay | Admitting: Obstetrics and Gynecology

## 2017-08-29 ENCOUNTER — Encounter: Payer: Self-pay | Admitting: Obstetrics and Gynecology

## 2017-08-29 ENCOUNTER — Other Ambulatory Visit (HOSPITAL_COMMUNITY)
Admission: RE | Admit: 2017-08-29 | Discharge: 2017-08-29 | Disposition: A | Discharge status: Home / Self Care | Payer: Commercial Managed Care - PPO | Source: Ambulatory Visit | Attending: Obstetrics and Gynecology | Admitting: Obstetrics and Gynecology

## 2017-08-29 VITALS — BP 128/76 | HR 73 | Ht 63.0 in | Wt 199.0 lb

## 2017-08-29 DIAGNOSIS — B977 Papillomavirus as the cause of diseases classified elsewhere: Secondary | ICD-10-CM | POA: Insufficient documentation

## 2017-08-29 DIAGNOSIS — N72 Inflammatory disease of cervix uteri: Secondary | ICD-10-CM | POA: Insufficient documentation

## 2017-08-29 LAB — HM PAP SMEAR: HM Pap smear: ABNORMAL

## 2017-08-29 NOTE — Progress Notes (Signed)
Obstetrics & Gynecology Office Visit   Chief Complaint:  Chief Complaint  Patient presents with  . Colposcopy    Referred by [facility]    History of Present Illness:Emma Phillips is a 32 y.o. woman who presents today for continued surveillance for history of dysplasia. Last pap obtained [facility] NIL HPV positive.  Prior pap preceding year reportedly also HPV positive.  Review of Systems: Review of Systems  Constitutional: Negative.   Genitourinary: Negative.   Skin: Negative.      Past Medical History:  Past Medical History:  Diagnosis Date  . Insomnia     Past Surgical History:  Past Surgical History:  Procedure Laterality Date  . COLPOSCOPY     Per patient age 32  . INTRAUTERINE DEVICE (IUD) INSERTION  2018    Gynecologic History: Patient's last menstrual period was 08/13/2017 (exact date).  Obstetric History: [id number]  Family History:  Family History  Problem Relation Age of Onset  . Thyroid disease Mother   . Breast cancer Paternal Grandmother     Social History:  Social History   Socioeconomic History  . Marital status: Married    Spouse name: Not on file  . Number of children: Not on file  . Years of education: Not on file  . Highest education level: Not on file  Occupational History  . Not on file  Social Needs  . Financial resource strain: Not on file  . Food insecurity:    Worry: Not on file    Inability: Not on file  . Transportation needs:    Medical: Not on file    Non-medical: Not on file  Tobacco Use  . Smoking status: Former Smoker    Types: Cigarettes  . Smokeless tobacco: Never Used  Substance and Sexual Activity  . Alcohol use: No  . Drug use: No  . Sexual activity: Yes    Partners: Male    Birth control/protection: IUD  Lifestyle  . Physical activity:    Days per week: Not on file    Minutes per session: Not on file  . Stress: Not on file  Relationships  . Social connections:    Talks on phone: Not on file    Gets  together: Not on file    Attends religious service: Not on file    Active member of club or organization: Not on file    Attends meetings of clubs or organizations: Not on file    Relationship status: Not on file  . Intimate partner violence:    Fear of current or ex partner: Not on file    Emotionally abused: Not on file    Physically abused: Not on file    Forced sexual activity: Not on file  Other Topics Concern  . Not on file  Social History Narrative  . Not on file    Allergies:  No Known Allergies  Medications: Prior to Admission medications   Not on File    Physical Exam Vitals:  Vitals:   08/29/17 1052  BP: 128/76  Pulse: 73   Patient's last menstrual period was 08/13/2017 (exact date).  General: NAD HEENT: normocephalic, anicteric Thyroid: no enlargement, no palpable nodules Pulmonary: No increased work of breathing Genitourinary:  External: Normal external female genitalia.  Normal urethral meatus, normal  Bartholin's and Skene's glands.    Vagina: Normal vaginal mucosa, no evidence of prolapse.    Cervix: Grossly normal in appearance, no bleeding  Uterus: Non-enlarged, mobile, normal contour.  No CMT  Adnexa: ovaries non-enlarged, no adnexal masses  Rectal: deferred  Lymphatic: no evidence of inguinal lymphadenopathy Extremities: no edema, erythema, or tenderness Neurologic: Grossly intact Psychiatric: mood appropriate, affect full  Female chaperone present for pelvic and breast  portions of the physical exam    GYNECOLOGY CLINIC COLPOSCOPY PROCEDURE NOTE  32 y.o. [id number] here for colposcopy for NIL and HR HPV+  pap smear  Discussed underlying role for HPV infection in the development of cervical dysplasia, its natural history and progression/regression, need for surveillance.  Is the patient  pregnant: No LMP: Patient's last menstrual period was 08/13/2017 (exact date). Smoking status:  reports that she has quit smoking. Her smoking use included  cigarettes. She has never used smokeless tobacco. Contraception: IUD  Patient given informed consent, signed copy in the chart, time out was performed.  The patient was position in dorsal lithotomy position. Speculum was placed the cervix was visualized.   After application of acetic acid colposcopic inspection of the cervix was undertaken.   Colposcopy adequate, full visualization of transformation zone: Yes no visible lesions; corresponding biopsies obtained.  (Random 12 O'Clock) ECC specimen obtained:  Yes  All specimens were labeled and sent to pathology.   Patient was given post procedure instructions.  Will follow up pathology and manage accordingly.  Routine preventative health maintenance measures emphasized.  OBGyn Exam     Assessment: 32 y.o. [id number] follow up for persitent HPV positive pap  Plan: Problem List Items Addressed This Visit    None    Visit Diagnoses    High risk human papilloma virus (HPV) infection of cervix    -  Primary   Relevant Orders   Surgical pathology (Completed)      - Follow up pap smear from today.   - I had a lengthly discussion with Emma Phillips  regarding the cause of dysplasia of the lower genital tract (including immunosuppression in the setting of HPV exposure and tobacco exposure). I explained the potential for progression to invasive malignancy, the recurrent nature of these lesions (and the need for close continued followup). Results of today's pap will dictate need for further evaluation and follow up per ASCCP guidelines..  - She is comfortable with the plan and had her questions answered.  - No follow-ups on file.   Vena AustriaAndreas Johniece Hornbaker, MD, FACOG [facility], [facility] , 7:30 PM

## 2017-09-01 ENCOUNTER — Encounter: Payer: Self-pay | Admitting: Obstetrics and Gynecology

## 2018-10-09 ENCOUNTER — Telehealth: Payer: Self-pay

## 2018-11-16 ENCOUNTER — Telehealth: Payer: Self-pay

## 2018-12-03 ENCOUNTER — Telehealth: Payer: Self-pay

## 2018-12-03 NOTE — Telephone Encounter (Signed)
Erroneous encounter.Analiese Krupka, RN  

## 2018-12-03 NOTE — Telephone Encounter (Signed)
Certified letter mailed to patient.  Closed to pap f/u.Aileen Fass, RN

## 2018-12-03 NOTE — Telephone Encounter (Signed)
See other phone note re: closed to pap f/u. Certified letter mailed. Aileen Fass, RN

## 2019-05-06 LAB — TSH: TSH: 6.27 — AB (ref <=5.90)

## 2019-06-14 ENCOUNTER — Ambulatory Visit (INDEPENDENT_AMBULATORY_CARE_PROVIDER_SITE_OTHER): Payer: Commercial Managed Care - PPO | Admitting: Family Medicine

## 2019-06-14 ENCOUNTER — Other Ambulatory Visit: Payer: Self-pay

## 2019-06-14 ENCOUNTER — Encounter: Payer: Self-pay | Admitting: Family Medicine

## 2019-06-14 VITALS — BP 120/60 | HR 62 | Ht 63.0 in | Wt 183.0 lb

## 2019-06-14 DIAGNOSIS — Z7689 Persons encountering health services in other specified circumstances: Secondary | ICD-10-CM | POA: Diagnosis not present

## 2019-06-14 DIAGNOSIS — E039 Hypothyroidism, unspecified: Secondary | ICD-10-CM | POA: Diagnosis not present

## 2019-06-14 DIAGNOSIS — S86899A Other injury of other muscle(s) and tendon(s) at lower leg level, unspecified leg, initial encounter: Secondary | ICD-10-CM

## 2019-06-14 MED ORDER — LEVOTHYROXINE SODIUM 25 MCG PO TABS: 25.0000 ug | ORAL_TABLET | Freq: Every day | ORAL | 0 refills | Status: DC

## 2019-06-14 NOTE — Patient Instructions (Signed)
Shin Splints  Shin splints is a painful condition that is felt either on the bone that is located in the front of the lower leg (tibia or shin bone) or in the muscles on either side of the bone. This condition happens when physical activities lead to inflammation of the muscles, tendons, and the thin layer of tissue that covers the shin bone. It may result from participating in sports or other intense exercise. What are the causes? This condition may be caused by:  Overuse of muscles.  Repetitive activities.  Flat feet or rigid arches. Activities that could contribute to shin splints include:  Having a sudden increase in exercise time.  Starting a new, intense activity.  Running up hills or long distances.  Playing sports that involve sudden starts and stops.  Not warming up before activity.  Wearing old or worn-out shoes. What are the signs or symptoms? The main symptom of this condition is pain that occurs:  On the front of the lower leg.  In the muscles on either side of the shin bone.  While exercising or at rest. How is this diagnosed? This condition may be diagnosed based on:  A physical exam.  Your symptoms.  An observation of you while you are walking or running.  X-rays or other imaging tests. These may be done to rule out other problems. How is this treated? Treatment for this condition depends on your age, history, overall health, and how bad the pain is. Most cases of shin splints can be managed by doing one or more of the following:  Resting.  Reducing the length and intensity of your exercise.  Stopping the activity that causes shin pain.  Taking medicines to control the inflammation.  Icing, massaging, stretching, and strengthening the affected area.  Wearing shoes that have rigid heels, shock absorption, and a good arch support. For severe shin pain, your health care provider may recommend that you use crutches to avoid putting weight on your  legs. Follow these instructions at home: Medicines  Take over-the-counter and prescription medicines only as told by your health care provider.  Do not drive or use heavy machinery while taking prescription pain medicine. Managing pain, stiffness, and swelling      If directed, apply ice to the painful area. Icing can help to relieve pain and swelling. ? Put ice in a plastic bag. ? Place a towel between your skin and the bag. ? Leave the ice on for 20 minutes, 2-3 times a day.  If directed, apply heat to the painful area before stretching exercises, or as told by your health care provider. Heat can help to relax your muscles. Use the heat source that your health care provider recommends, such as a moist heat pack or a heating pad. ? Place a towel between your skin and the heat source. ? Leave the heat on for 20-30 minutes. ? Remove the heat if your skin turns bright red. This is especially important if you are unable to feel pain, heat, or cold. You may have a greater risk of getting burned.  Massage, stretch, and strengthen the affected area as directed by your health care provider.  Wear compression sleeves or socks as told by your health care provider.  Raise (elevate) your legs above the level of your heart while you are sitting or lying down. Activity  Rest as needed. Return to activity gradually as told by your health care provider.  When you start exercising again, begin with non-weight-bearing exercises,   such as cycling or swimming.  Stop running if the pain returns.  Warm up properly before exercising.  Run on a surface that is level and fairly firm.  Gradually change the intensity of an exercise.  If you increase your running distance, add only 5-10% to your distance each week. This means that if you are running 5 miles this week, you should only increase your run by - mile for next week. General instructions  Wear shoes that have rigid heels, shock absorption,  and a good arch support.  Change your athletic shoes every 6 months, or every 350-450 miles.  Keep all follow-up visits as told by your health care provider. This is important. Contact a health care provider if:  Your symptoms continue, or they worsen even after treatment.  The location, intensity, or type of pain changes over time.  You have swelling in your lower leg that gets worse.  Your shin becomes red and feels warm. Get help right away if:  You have severe pain.  You have trouble walking. Summary  Shin splints happens when physical activities lead to inflammation of the muscles, tendons, and the thin layer of tissue that covers the shin bone.  Treatments may include medicines, resting, and icing.  Return to activity gradually as directed by your health care provider.  Make sure you know what symptoms should cause you to contact your health care provider. This information is not intended to replace advice given to you by your health care provider. Make sure you discuss any questions you have with your health care provider. Document Revised: 01/23/2017 Document Reviewed: 01/23/2017 Elsevier Patient Education  2020 [employer]  

## 2019-06-14 NOTE — Progress Notes (Signed)
Date:  06/14/2019   Name:  Emma Phillips   DOB:  12-Mar-1985   MRN:  [id number]   Chief Complaint: Establish Care (wants thyroid checked- last level was 6.27 in April)  Patient is a 34 year old female who presents for an establish care exam. The patient reports the following problems: hypothyroid. Health maintenance has been reviewed up to date for age.  Thyroid Problem Presents for initial visit. Symptoms include anxiety, constipation, depressed mood, diaphoresis, fatigue, heat intolerance, nail problem and weight gain. Patient reports no cold intolerance, diarrhea, dry skin, hair loss, hoarse voice, leg swelling, menstrual problem, palpitations, tremors, visual change or weight loss. The symptoms have been stable (uncontrolled).    Lab Results  Component Value Date   CREATININE 0.57 08/07/2015   BUN 11 08/07/2015   NA 140 08/07/2015   K 4.0 08/07/2015   CL 102 08/07/2015   CO2 21 08/07/2015   No results found for: CHOL, HDL, LDLCALC, LDLDIRECT, TRIG, CHOLHDL Lab Results  Component Value Date   TSH 6.27 (A) 05/06/2019   No results found for: HGBA1C Lab Results  Component Value Date   WBC 7.8 08/07/2015   HGB 15.0 08/07/2015   HCT 43.0 08/07/2015   MCV 101 (H) 08/07/2015   PLT 265 08/07/2015   No results found for: ALT, AST, GGT, ALKPHOS, BILITOT   Review of Systems  Constitutional: Positive for diaphoresis, fatigue and weight gain. Negative for chills, fever, unexpected weight change and weight loss.  HENT: Negative for congestion, ear discharge, ear pain, hoarse voice, rhinorrhea, sinus pressure, sneezing and sore throat.   Eyes: Negative for photophobia, pain, discharge, redness and itching.  Respiratory: Negative for cough, shortness of breath, wheezing and stridor.   Cardiovascular: Negative for palpitations.  Gastrointestinal: Positive for constipation. Negative for abdominal pain, blood in stool, diarrhea, nausea and vomiting.  Endocrine: Positive for heat  intolerance. Negative for cold intolerance, polydipsia, polyphagia and polyuria.  Genitourinary: Negative for dysuria, flank pain, frequency, hematuria, menstrual problem, pelvic pain, urgency, vaginal bleeding and vaginal discharge.  Musculoskeletal: Negative for arthralgias, back pain and myalgias.  Skin: Negative for rash.  Allergic/Immunologic: Negative for environmental allergies and food allergies.  Neurological: Negative for dizziness, tremors, weakness, light-headedness, numbness and headaches.  Hematological: Negative for adenopathy. Does not bruise/bleed easily.  Psychiatric/Behavioral: Negative for dysphoric mood. The patient is nervous/anxious.     Patient Active Problem List   Diagnosis Date Noted  . Bipolar 2 disorder, major depressive episode ([place]) 11/24/2013    Allergies  Allergen Reactions  . Penicillins Anaphylaxis    Past Surgical History:  Procedure Laterality Date  . COLPOSCOPY     Per patient age 62  . INTRAUTERINE DEVICE (IUD) INSERTION  2018    Social History   Tobacco Use  . Smoking status: Former Smoker    Types: Cigarettes  . Smokeless tobacco: Never Used  Substance Use Topics  . Alcohol use: No  . Drug use: No     Medication list has been reviewed and updated.  Current Meds  Medication Sig  . escitalopram (LEXAPRO) 10 MG tablet Take 10 mg by mouth daily.    PHQ 2/9 Scores 06/14/2019 08/07/2015  PHQ - 2 Score 0 6  PHQ- 9 Score 2 23    BP Readings from Last 3 Encounters:  06/14/19 120/60  08/29/17 128/76  08/07/15 124/78    Physical Exam Vitals and nursing note reviewed.  Constitutional:      Appearance: She is well-developed.  HENT:  Head: Normocephalic.     Right Ear: Tympanic membrane, ear canal and external ear normal.     Left Ear: Tympanic membrane, ear canal and external ear normal.     Nose: Nose normal.     Mouth/Throat:     Mouth: Mucous membranes are moist.  Eyes:     General: Lids are everted, no foreign  bodies appreciated. No scleral icterus.       Left eye: No foreign body or hordeolum.     Conjunctiva/sclera: Conjunctivae normal.     Right eye: Right conjunctiva is not injected.     Left eye: Left conjunctiva is not injected.     Pupils: Pupils are equal, round, and reactive to light.  Neck:     Thyroid: No thyromegaly.     Vascular: No JVD.     Trachea: No tracheal deviation.  Cardiovascular:     Rate and Rhythm: Normal rate and regular rhythm.     Heart sounds: Normal heart sounds. No murmur. No friction rub. No gallop.   Pulmonary:     Effort: Pulmonary effort is normal. No respiratory distress.     Breath sounds: Normal breath sounds. No wheezing, rhonchi or rales.  Abdominal:     General: Bowel sounds are normal.     Palpations: Abdomen is soft. There is no mass.     Tenderness: There is no abdominal tenderness. There is no guarding or rebound.  Musculoskeletal:        General: No tenderness. Normal range of motion.     Cervical back: Normal range of motion and neck supple.  Lymphadenopathy:     Cervical: No cervical adenopathy.  Skin:    General: Skin is warm.     Capillary Refill: Capillary refill takes less than 2 seconds.     Findings: No bruising, erythema or rash.  Neurological:     Mental Status: She is alert and oriented to person, place, and time.     Cranial Nerves: No cranial nerve deficit.     Deep Tendon Reflexes: Reflexes normal.  Psychiatric:        Mood and Affect: Mood is not anxious or depressed.     Wt Readings from Last 3 Encounters:  06/14/19 183 lb (83 kg)  08/29/17 199 lb (90.3 kg)  08/07/15 172 lb (78 kg)    BP 120/60   Pulse 62   Ht 5\' 3"  (1.6 m)   Wt 183 lb (83 kg)   BMI 32.42 kg/m   Assessment and Plan:   1. Establishing care with new doctor, encounter for Patient presents today to establish care with new physician per suggestion of her psychiatrist.  As noted below she had a decreased of thyroid function which was indicated by  an elevated TSH prior to initiating a new medication.  Given the cost of lab work we will initiate levothyroxine 25 mcg daily and recheck patient in 4 weeks at which time we will do a TSH with T4 T3 and uptake.  Did have some discomfort when I examined her thyroid but there was no enlargement and no nodularity.  2. Hypothyroidism, unspecified type As noted TSH was elevated indicating an underactive thyroid and patient was initiated on 25 mcg Synthroid.  There was some tenderness noted on examination of the thyroid but patient did not have symptoms suggesting Hashimoto's.  We will recheck patient in 4 weeks at that time we will reexamine. - levothyroxine (SYNTHROID) 25 MCG tablet; Take 1 tablet (25 mcg total)  by mouth daily before breakfast.  Dispense: 60 tablet; Refill: 0  3. Medial tibial stress syndrome, unspecified laterality, initial encounter Patient also notes that she has had some tenderness over the anterior aspect of her shins when she is walking/running.  They have suggested that she reduce her intensity of workouts and to walk on the surface that is conducive to walking such as a track or treadmill.  Information was given on icing the shins after activity.

## 2019-07-06 ENCOUNTER — Other Ambulatory Visit: Payer: Self-pay | Admitting: Family Medicine

## 2019-07-06 DIAGNOSIS — E039 Hypothyroidism, unspecified: Secondary | ICD-10-CM

## 2019-07-06 NOTE — Telephone Encounter (Signed)
Pt has appt in few days- only refilling for 30 days in case of dose adjustment Requested Prescriptions  Pending Prescriptions Disp Refills  . levothyroxine (SYNTHROID) 25 MCG tablet [Pharmacy Med Name: LEVOTHYROXINE 25 MCG TABLET] 30 tablet 0    Sig: TAKE 1 TABLET BY MOUTH DAILY BEFORE BREAKFAST.     Endocrinology:  Hypothyroid Agents Failed - 07/06/2019 10:34 AM      Failed - TSH needs to be rechecked within 3 months after an abnormal result. Refill until TSH is due.      Failed - TSH in normal range and within 360 days    TSH  Date Value Ref Range Status  05/06/2019 6.27 (A) 0.41 - 5.90 Final  08/07/2015 2.080 0.450 - 4.500 uIU/mL Final         Passed - Valid encounter within last 12 months    Recent Outpatient Visits          3 weeks ago Establishing care with new doctor, encounter for   [facility] Duanne Limerick, MD   3 years ago Encounter to establish care with new doctor   [facility] Duanne Limerick, MD      Future Appointments            In 6 days Duanne Limerick, MD [facility], [facility]

## 2019-07-12 ENCOUNTER — Encounter: Payer: Self-pay | Admitting: Family Medicine

## 2019-07-12 ENCOUNTER — Other Ambulatory Visit: Payer: Self-pay

## 2019-07-12 ENCOUNTER — Ambulatory Visit (INDEPENDENT_AMBULATORY_CARE_PROVIDER_SITE_OTHER): Payer: Commercial Managed Care - PPO | Admitting: Family Medicine

## 2019-07-12 VITALS — BP 122/80 | HR 68 | Ht 63.0 in | Wt 180.0 lb

## 2019-07-12 DIAGNOSIS — Z6831 Body mass index (BMI) 31.0-31.9, adult: Secondary | ICD-10-CM

## 2019-07-12 DIAGNOSIS — E039 Hypothyroidism, unspecified: Secondary | ICD-10-CM

## 2019-07-12 NOTE — Progress Notes (Signed)
Date:  07/12/2019   Name:  Emma Phillips   DOB:  02/19/1985   MRN:  [id number]   Chief Complaint: Hypothyroidism (follow up )  Thyroid Problem Presents for initial visit. Symptoms include constipation, dry skin, fatigue, hair loss, heat intolerance, nail problem and weight gain. Patient reports no anxiety, cold intolerance, depressed mood, diaphoresis, diarrhea, hoarse voice, leg swelling, menstrual problem, palpitations, tremors, visual change or weight loss. The symptoms have been improving.    Lab Results  Component Value Date   CREATININE 0.57 08/07/2015   BUN 11 08/07/2015   NA 140 08/07/2015   K 4.0 08/07/2015   CL 102 08/07/2015   CO2 21 08/07/2015   No results found for: CHOL, HDL, LDLCALC, LDLDIRECT, TRIG, CHOLHDL Lab Results  Component Value Date   TSH 6.27 (A) 05/06/2019   No results found for: HGBA1C Lab Results  Component Value Date   WBC 7.8 08/07/2015   HGB 15.0 08/07/2015   HCT 43.0 08/07/2015   MCV 101 (H) 08/07/2015   PLT 265 08/07/2015   No results found for: ALT, AST, GGT, ALKPHOS, BILITOT   Review of Systems  Constitutional: Positive for fatigue and weight gain. Negative for chills, diaphoresis, fever, unexpected weight change and weight loss.  HENT: Negative for congestion, ear discharge, ear pain, hoarse voice, rhinorrhea, sinus pressure, sneezing and sore throat.   Eyes: Negative for photophobia, pain, discharge, redness and itching.  Respiratory: Negative for cough, shortness of breath, wheezing and stridor.   Cardiovascular: Negative for palpitations.  Gastrointestinal: Positive for constipation. Negative for abdominal pain, blood in stool, diarrhea, nausea and vomiting.  Endocrine: Positive for heat intolerance. Negative for cold intolerance, polydipsia, polyphagia and polyuria.  Genitourinary: Negative for dysuria, flank pain, frequency, hematuria, menstrual problem, pelvic pain, urgency, vaginal bleeding and vaginal discharge.    Musculoskeletal: Negative for arthralgias, back pain and myalgias.  Skin: Negative for rash.  Allergic/Immunologic: Negative for environmental allergies and food allergies.  Neurological: Negative for dizziness, tremors, weakness, light-headedness, numbness and headaches.  Hematological: Negative for adenopathy. Does not bruise/bleed easily.  Psychiatric/Behavioral: Negative for dysphoric mood. The patient is not nervous/anxious.     Patient Active Problem List   Diagnosis Date Noted  . Bipolar 2 disorder, major depressive episode ([place]) 11/24/2013    Allergies  Allergen Reactions  . Penicillins Anaphylaxis    Past Surgical History:  Procedure Laterality Date  . COLPOSCOPY     Per patient age 71  . INTRAUTERINE DEVICE (IUD) INSERTION  2018    Social History   Tobacco Use  . Smoking status: Former Smoker    Types: Cigarettes    Quit date: 11/19/2016    Years since quitting: 2.6  . Smokeless tobacco: Never Used  Substance Use Topics  . Alcohol use: No  . Drug use: No     Medication list has been reviewed and updated.  Current Meds  Medication Sig  . escitalopram (LEXAPRO) 10 MG tablet Take 10 mg by mouth daily.  Marland Kitchen levothyroxine (SYNTHROID) 25 MCG tablet TAKE 1 TABLET BY MOUTH DAILY BEFORE BREAKFAST.    PHQ 2/9 Scores 07/12/2019 06/14/2019 08/07/2015  PHQ - 2 Score 0 0 6  PHQ- 9 Score 0 2 23    GAD 7 : Generalized Anxiety Score 07/12/2019 06/14/2019 08/07/2015  Nervous, Anxious, on Edge 0 0 3  Control/stop worrying 0 0 3  Worry too much - different things 0 0 3  Trouble relaxing 0 1 1  Restless 0 0 1  Easily annoyed or irritable 0 2 3  Afraid - awful might happen 0 0 3  Total GAD 7 Score 0 3 17  Anxiety Difficulty Not difficult at all Not difficult at all Very difficult    BP Readings from Last 3 Encounters:  07/12/19 122/80  06/14/19 120/60  08/29/17 128/76    Physical Exam Vitals and nursing note reviewed.  Constitutional:      Appearance: She is  well-developed.  HENT:     Head: Normocephalic.     Right Ear: External ear normal.     Left Ear: External ear normal.  Eyes:     General: Lids are everted, no foreign bodies appreciated. No scleral icterus.       Left eye: No foreign body or hordeolum.     Conjunctiva/sclera: Conjunctivae normal.     Right eye: Right conjunctiva is not injected.     Left eye: Left conjunctiva is not injected.     Pupils: Pupils are equal, round, and reactive to light.  Neck:     Thyroid: No thyromegaly.     Vascular: No JVD.     Trachea: No tracheal deviation.  Cardiovascular:     Rate and Rhythm: Normal rate and regular rhythm.     Heart sounds: Normal heart sounds. No murmur heard.  No friction rub. No gallop.   Pulmonary:     Effort: Pulmonary effort is normal. No respiratory distress.     Breath sounds: Normal breath sounds. No wheezing or rales.  Abdominal:     General: Bowel sounds are normal.     Palpations: Abdomen is soft. There is no mass.     Tenderness: There is no abdominal tenderness. There is no guarding or rebound.  Musculoskeletal:        General: No tenderness. Normal range of motion.     Cervical back: Normal range of motion and neck supple.  Lymphadenopathy:     Cervical: No cervical adenopathy.  Skin:    General: Skin is warm.     Findings: No rash.  Neurological:     Mental Status: She is alert and oriented to person, place, and time.     Cranial Nerves: No cranial nerve deficit.     Deep Tendon Reflexes: Reflexes normal.  Psychiatric:        Mood and Affect: Mood is not anxious or depressed.     Wt Readings from Last 3 Encounters:  07/12/19 180 lb (81.6 kg)  06/14/19 183 lb (83 kg)  08/29/17 199 lb (90.3 kg)    BP 122/80   Pulse 68   Ht 5\' 3"  (1.6 m)   Wt 180 lb (81.6 kg)   SpO2 99%   BMI 31.89 kg/m   Assessment and Plan: 1. Hypothyroidism, unspecified type New onset.  Persistent.  Patient is relatively asymptomatic but was noted to have elevated TSH  last visit.  Patient was initiated on current dosing of levothyroxine 25 mcg.  We will check a TSH and either continue at current dosing with necessary increase to 50 mcg. - TSH  2. Adult BMI 31.0-31.9 kg/sq m Health risks of being over weight were discussed and patient was counseled on weight loss options and exercise.

## 2019-07-12 NOTE — Patient Instructions (Signed)

## 2019-07-13 LAB — TSH: TSH: 2.53 u[IU]/mL (ref 0.450–4.500)

## 2019-07-15 ENCOUNTER — Other Ambulatory Visit: Payer: Self-pay

## 2019-07-15 DIAGNOSIS — E039 Hypothyroidism, unspecified: Secondary | ICD-10-CM

## 2019-07-15 MED ORDER — LEVOTHYROXINE SODIUM 25 MCG PO TABS: ORAL_TABLET | ORAL | 5 refills | Status: DC

## 2019-11-23 ENCOUNTER — Other Ambulatory Visit: Payer: Self-pay | Admitting: Family Medicine

## 2019-11-23 DIAGNOSIS — E039 Hypothyroidism, unspecified: Secondary | ICD-10-CM

## 2019-11-23 NOTE — Telephone Encounter (Signed)
Requested Prescriptions  Pending Prescriptions Disp Refills  . levothyroxine (SYNTHROID) 25 MCG tablet [Pharmacy Med Name: LEVOTHYROXINE 25 MCG TABLET] 90 tablet 1    Sig: TAKE 1 TABLET BY MOUTH EVERY DAY BEFORE BREAKFAST     Endocrinology:  Hypothyroid Agents Failed - 11/23/2019  9:07 AM      Failed - TSH needs to be rechecked within 3 months after an abnormal result. Refill until TSH is due.      Passed - TSH in normal range and within 360 days    TSH  Date Value Ref Range Status  07/12/2019 2.530 0.450 - 4.500 uIU/mL Final         Passed - Valid encounter within last 12 months    Recent Outpatient Visits          4 months ago Hypothyroidism, unspecified type   [facility] Duanne Limerick, MD   5 months ago Establishing care with new doctor, encounter for   [facility] Duanne Limerick, MD   4 years ago Encounter to establish care with new doctor   [facility] Duanne Limerick, MD

## 2020-03-02 ENCOUNTER — Encounter: Payer: Self-pay | Admitting: Family Medicine

## 2020-03-02 DIAGNOSIS — Z975 Presence of (intrauterine) contraceptive device: Secondary | ICD-10-CM | POA: Insufficient documentation

## 2020-05-17 ENCOUNTER — Other Ambulatory Visit: Payer: Self-pay | Admitting: Family Medicine

## 2020-05-17 DIAGNOSIS — E039 Hypothyroidism, unspecified: Secondary | ICD-10-CM

## 2020-05-17 NOTE — Telephone Encounter (Signed)
Requested Prescriptions  Pending Prescriptions Disp Refills  . levothyroxine (SYNTHROID) 25 MCG tablet [Pharmacy Med Name: LEVOTHYROXINE 25 MCG TABLET] 60 tablet 0    Sig: TAKE 1 TABLET BY MOUTH EVERY DAY BEFORE BREAKFAST     Endocrinology:  Hypothyroid Agents Failed - 05/17/2020 10:25 AM      Failed - TSH needs to be rechecked within 3 months after an abnormal result. Refill until TSH is due.      Passed - TSH in normal range and within 360 days    TSH  Date Value Ref Range Status  07/12/2019 2.530 0.450 - 4.500 uIU/mL Final         Passed - Valid encounter within last 12 months    Recent Outpatient Visits          10 months ago Hypothyroidism, unspecified type   [facility] Duanne Limerick, MD   11 months ago Establishing care with new doctor, encounter for   [facility] Duanne Limerick, MD   4 years ago Encounter to establish care with new doctor   [facility] Duanne Limerick, MD

## 2020-06-10 ENCOUNTER — Other Ambulatory Visit: Payer: Self-pay | Admitting: Family Medicine

## 2020-06-10 DIAGNOSIS — E039 Hypothyroidism, unspecified: Secondary | ICD-10-CM

## 2020-08-10 ENCOUNTER — Ambulatory Visit (INDEPENDENT_AMBULATORY_CARE_PROVIDER_SITE_OTHER): Payer: Commercial Managed Care - PPO | Admitting: Family Medicine

## 2020-08-10 ENCOUNTER — Encounter: Payer: Self-pay | Admitting: Family Medicine

## 2020-08-10 ENCOUNTER — Other Ambulatory Visit: Payer: Self-pay

## 2020-08-10 VITALS — BP 120/80 | HR 68 | Ht 63.0 in | Wt 205.0 lb

## 2020-08-10 DIAGNOSIS — R5383 Other fatigue: Secondary | ICD-10-CM | POA: Diagnosis not present

## 2020-08-10 DIAGNOSIS — E039 Hypothyroidism, unspecified: Secondary | ICD-10-CM | POA: Diagnosis not present

## 2020-08-10 MED ORDER — LEVOTHYROXINE SODIUM 25 MCG PO TABS: ORAL_TABLET | ORAL | 1 refills | Status: DC

## 2020-08-10 NOTE — Progress Notes (Addendum)
Date:  08/10/2020   Name:  Emma Phillips   DOB:  10/15/1985   MRN:  [id number]   Chief Complaint: Hypothyroidism  Thyroid Problem Presents for follow-up visit. Symptoms include constipation, diaphoresis, dry skin, fatigue, hair loss, heat intolerance, menstrual problem, nail problem and weight gain. Patient reports no anxiety, cold intolerance, depressed mood, diarrhea, hoarse voice, leg swelling, palpitations, visual change or weight loss. The symptoms have been stable.   Lab Results  Component Value Date   CREATININE 0.57 08/07/2015   BUN 11 08/07/2015   NA 140 08/07/2015   K 4.0 08/07/2015   CL 102 08/07/2015   CO2 21 08/07/2015   No results found for: CHOL, HDL, LDLCALC, LDLDIRECT, TRIG, CHOLHDL Lab Results  Component Value Date   TSH 2.530 07/12/2019   No results found for: HGBA1C Lab Results  Component Value Date   WBC 7.8 08/07/2015   HGB 15.0 08/07/2015   HCT 43.0 08/07/2015   MCV 101 (H) 08/07/2015   PLT 265 08/07/2015   No results found for: ALT, AST, GGT, ALKPHOS, BILITOT   Review of Systems  Constitutional:  Positive for diaphoresis, fatigue and weight gain. Negative for weight loss.  HENT:  Negative for hoarse voice.   Cardiovascular:  Negative for palpitations.  Gastrointestinal:  Positive for constipation. Negative for diarrhea.  Endocrine: Positive for heat intolerance. Negative for cold intolerance.  Genitourinary:  Positive for menstrual problem.  Psychiatric/Behavioral:  The patient is not nervous/anxious.    Patient Active Problem List   Diagnosis Date Noted   IUD (intrauterine device) in place 03/02/2020   Bipolar 2 disorder, major depressive episode (HCC) 11/24/2013    Allergies  Allergen Reactions   Penicillins Anaphylaxis    Past Surgical History:  Procedure Laterality Date   COLPOSCOPY     Per patient age 35   INTRAUTERINE DEVICE (IUD) INSERTION  2018    Social History   Tobacco Use   Smoking status: Former    Types:  Cigarettes    Quit date: 11/19/2016    Years since quitting: 3.7   Smokeless tobacco: Never  Substance Use Topics   Alcohol use: No   Drug use: No     Medication list has been reviewed and updated.  Current Meds  Medication Sig   escitalopram (LEXAPRO) 10 MG tablet Take 10 mg by mouth daily.   lamoTRIgine (LAMICTAL) 100 MG tablet Take 100 mg by mouth 2 (two) times daily. Dr Rexene Edison   levothyroxine (SYNTHROID) 25 MCG tablet TAKE 1 TABLET BY MOUTH EVERY DAY BEFORE BREAKFAST   vitamin B-12 (CYANOCOBALAMIN) 500 MCG tablet Take 500 mcg by mouth daily.    PHQ 2/9 Scores 08/10/2020 07/12/2019 06/14/2019 08/07/2015  PHQ - 2 Score 0 0 0 6  PHQ- 9 Score 0 0 2 23    GAD 7 : Generalized Anxiety Score 08/10/2020 07/12/2019 06/14/2019 08/07/2015  Nervous, Anxious, on Edge 0 0 0 3  Control/stop worrying 0 0 0 3  Worry too much - different things 0 0 0 3  Trouble relaxing 0 0 1 1  Restless 0 0 0 1  Easily annoyed or irritable 0 0 2 3  Afraid - awful might happen 0 0 0 3  Total GAD 7 Score 0 0 3 17  Anxiety Difficulty - Not difficult at all Not difficult at all Very difficult    BP Readings from Last 3 Encounters:  08/10/20 120/80  07/12/19 122/80  06/14/19 120/60    Physical Exam Vitals  and nursing note reviewed.  Constitutional:      General: She is not in acute distress.    Appearance: She is not diaphoretic.  HENT:     Head: Normocephalic and atraumatic.     Right Ear: Tympanic membrane, ear canal and external ear normal.     Left Ear: Tympanic membrane, ear canal and external ear normal.     Nose: Nose normal.  Eyes:     General:        Right eye: No discharge.        Left eye: No discharge.     Conjunctiva/sclera: Conjunctivae normal.     Pupils: Pupils are equal, round, and reactive to light.  Neck:     Thyroid: No thyromegaly.     Vascular: No JVD.  Cardiovascular:     Rate and Rhythm: Normal rate and regular rhythm.     Heart sounds: Normal heart sounds. No murmur heard.    No friction rub. No gallop.  Pulmonary:     Effort: Pulmonary effort is normal.     Breath sounds: Normal breath sounds.  Abdominal:     General: Bowel sounds are normal.     Palpations: Abdomen is soft. There is no mass.     Tenderness: There is no abdominal tenderness. There is no guarding.  Musculoskeletal:        General: Normal range of motion.     Cervical back: Normal range of motion and neck supple.  Lymphadenopathy:     Cervical: No cervical adenopathy.  Skin:    General: Skin is warm and dry.  Neurological:     Mental Status: She is alert.     Deep Tendon Reflexes: Reflexes are normal and symmetric.    Wt Readings from Last 3 Encounters:  08/10/20 205 lb (93 kg)  07/12/19 180 lb (81.6 kg)  06/14/19 183 lb (83 kg)    BP 120/80   Pulse 68   Ht 5\' 3"  (1.6 m)   Wt 205 lb (93 kg)   BMI 36.31 kg/m   Assessment and Plan:  1. Hypothyroidism, unspecified type Chronic.  Uncontrolled perhaps.  Stable.  Patient was started on thyroid supplementation last year and had a TSH that confirmed in reasonable range however patient has not been back for over an 9 months and there has been a steady weight gain with more fatigue.  Other symptoms of hypothyroid also exist such as constipation and patient is now having multiple periods a month.  We will check a thyroid panel with TSH today in adjust accordingly. - Thyroid Panel With TSH - levothyroxine (SYNTHROID) 25 MCG tablet; 1 tablet daily  Dispense: 90 tablet; Refill: 1  2. Fatigue, unspecified type New onset.  Patient has had recent weight gain of 25 pounds along with fatigue which may reflect and under supplemented thyroid however we will check renal function panel and CBC given that she is got menstrual concerns at this time.  Patient states that she is to go back to her health department because she got a certified letter concerning her IUD that may need to be dealt with.  I offered her appointment with gynecology however she has  declined at this time to go to the health department for reevaluation of her IUD. - Thyroid Panel With TSH - Renal Function Panel - CBC with Differential/Platelet

## 2020-08-11 ENCOUNTER — Other Ambulatory Visit: Payer: Self-pay

## 2020-08-11 DIAGNOSIS — E039 Hypothyroidism, unspecified: Secondary | ICD-10-CM

## 2020-08-11 LAB — CBC WITH DIFFERENTIAL/PLATELET
Basophils Absolute: 0 10*3/uL (ref 0.0–0.2)
Basos: 0 %
EOS (ABSOLUTE): 0.1 10*3/uL (ref 0.0–0.4)
Eos: 2 %
Hematocrit: 40.1 % (ref 34.0–46.6)
Hemoglobin: 13.7 g/dL (ref 11.1–15.9)
Immature Grans (Abs): 0 10*3/uL (ref 0.0–0.1)
Immature Granulocytes: 0 %
Lymphocytes Absolute: 1.5 10*3/uL (ref 0.7–3.1)
Lymphs: 22 %
MCH: 32.4 pg (ref 26.6–33.0)
MCHC: 34.2 g/dL (ref 31.5–35.7)
MCV: 95 fL (ref 79–97)
Monocytes Absolute: 0.4 10*3/uL (ref 0.1–0.9)
Monocytes: 6 %
Neutrophils Absolute: 4.7 10*3/uL (ref 1.4–7.0)
Neutrophils: 70 %
Platelets: 216 10*3/uL (ref 150–450)
RBC: 4.23 x10E6/uL (ref 3.77–5.28)
RDW: 12.7 % (ref 11.7–15.4)
WBC: 6.9 10*3/uL (ref 3.4–10.8)

## 2020-08-11 LAB — RENAL FUNCTION PANEL
Albumin: 4.6 g/dL (ref 3.8–4.8)
BUN/Creatinine Ratio: 17 (ref 9–23)
BUN: 13 mg/dL (ref 6–20)
CO2: 21 mmol/L (ref 20–29)
Calcium: 9.5 mg/dL (ref 8.7–10.2)
Chloride: 103 mmol/L (ref 96–106)
Creatinine, Ser: 0.76 mg/dL (ref 0.57–1.00)
Glucose: 107 mg/dL — ABNORMAL HIGH (ref 65–99)
Phosphorus: 2.5 mg/dL — ABNORMAL LOW (ref 3.0–4.3)
Potassium: 4.6 mmol/L (ref 3.5–5.2)
Sodium: 141 mmol/L (ref 134–144)
eGFR: 104 mL/min/{1.73_m2} (ref >59)

## 2020-08-11 LAB — THYROID PANEL WITH TSH
Free Thyroxine Index: 1.3 (ref 1.2–4.9)
T3 Uptake Ratio: 23 % — ABNORMAL LOW (ref 24–39)
T4, Total: 5.7 ug/dL (ref 4.5–12.0)
TSH: 5.38 u[IU]/mL — ABNORMAL HIGH (ref 0.450–4.500)

## 2020-08-11 MED ORDER — LEVOTHYROXINE SODIUM 50 MCG PO TABS: ORAL_TABLET | ORAL | 1 refills | Status: DC

## 2020-08-11 NOTE — Progress Notes (Signed)
Called pt she had viewed her labs on [facility]. Pt is to start taking 50 mcg per Dr. Yetta Barre. Pt has been taking 25 mcg daily she is to start taking 2- 25 mcg. We will send in 50 mcg once pt picks up 50 mcg she is to start taking one tablet a day. Pt verbalized understanding.   KP

## 2020-09-05 ENCOUNTER — Other Ambulatory Visit: Payer: Self-pay | Admitting: Internal Medicine

## 2020-09-05 DIAGNOSIS — E039 Hypothyroidism, unspecified: Secondary | ICD-10-CM

## 2020-09-05 NOTE — Telephone Encounter (Signed)
Last RF 08/11/20 #30 1 RF too soon

## 2020-09-15 ENCOUNTER — Other Ambulatory Visit: Payer: Commercial Managed Care - PPO

## 2020-09-15 ENCOUNTER — Other Ambulatory Visit: Payer: Self-pay

## 2020-09-15 DIAGNOSIS — E039 Hypothyroidism, unspecified: Secondary | ICD-10-CM

## 2020-09-16 LAB — TSH: TSH: 3.17 u[IU]/mL (ref 0.450–4.500)

## 2020-09-17 ENCOUNTER — Other Ambulatory Visit: Payer: Self-pay

## 2020-09-17 DIAGNOSIS — E039 Hypothyroidism, unspecified: Secondary | ICD-10-CM

## 2020-09-17 MED ORDER — LEVOTHYROXINE SODIUM 50 MCG PO TABS: ORAL_TABLET | ORAL | 1 refills | Status: DC

## 2020-11-12 ENCOUNTER — Other Ambulatory Visit: Payer: Self-pay | Admitting: Family Medicine

## 2020-11-12 DIAGNOSIS — E039 Hypothyroidism, unspecified: Secondary | ICD-10-CM

## 2020-11-13 NOTE — Telephone Encounter (Signed)
Requested Prescriptions  Pending Prescriptions Disp Refills  . levothyroxine (SYNTHROID) 50 MCG tablet [Pharmacy Med Name: LEVOTHYROXINE 50 MCG TABLET] 30 tablet 2    Sig: TAKE 1 TABLET BY MOUTH EVERY DAY     Endocrinology:  Hypothyroid Agents Failed - 11/12/2020 12:32 PM      Failed - TSH needs to be rechecked within 3 months after an abnormal result. Refill until TSH is due.      Passed - TSH in normal range and within 360 days    TSH  Date Value Ref Range Status  09/15/2020 3.170 0.450 - 4.500 uIU/mL Final         Passed - Valid encounter within last 12 months    Recent Outpatient Visits          3 months ago Hypothyroidism, unspecified type   [facility] Duanne Limerick, MD   1 year ago Hypothyroidism, unspecified type   [facility] Duanne Limerick, MD   1 year ago Establishing care with new doctor, encounter for   [facility] Duanne Limerick, MD   5 years ago Encounter to establish care with new doctor   [facility] Duanne Limerick, MD      Future Appointments            In 2 months Duanne Limerick, MD [facility], [facility]

## 2021-01-06 ENCOUNTER — Other Ambulatory Visit: Payer: Self-pay | Admitting: Family Medicine

## 2021-01-06 DIAGNOSIS — E039 Hypothyroidism, unspecified: Secondary | ICD-10-CM

## 2021-02-10 ENCOUNTER — Ambulatory Visit: Payer: Self-pay | Admitting: Family Medicine

## 2021-02-17 ENCOUNTER — Ambulatory Visit: Payer: Commercial Managed Care - PPO | Admitting: Family Medicine

## 2021-02-17 ENCOUNTER — Encounter: Payer: Self-pay | Admitting: Family Medicine

## 2021-02-17 ENCOUNTER — Other Ambulatory Visit: Payer: Self-pay

## 2021-02-17 VITALS — BP 120/80 | HR 68 | Ht 63.0 in | Wt 211.0 lb

## 2021-02-17 DIAGNOSIS — E039 Hypothyroidism, unspecified: Secondary | ICD-10-CM

## 2021-02-17 DIAGNOSIS — Z6837 Body mass index (BMI) 37.0-37.9, adult: Secondary | ICD-10-CM

## 2021-02-17 NOTE — Patient Instructions (Signed)

## 2021-02-17 NOTE — Progress Notes (Signed)
Date:  02/17/2021   Name:  Emma Phillips   DOB:  1985/04/06   MRN:  [id number]   Chief Complaint: Hypothyroidism  Thyroid Problem Presents for follow-up visit. Symptoms include anxiety, constipation, depressed mood, diaphoresis, dry skin, fatigue, hair loss, nail problem and weight gain. Patient reports no diarrhea, heat intolerance, leg swelling, menstrual problem, palpitations or visual change. The symptoms have been worsening.   Lab Results  Component Value Date   NA 141 08/10/2020   K 4.6 08/10/2020   CO2 21 08/10/2020   GLUCOSE 107 (H) 08/10/2020   BUN 13 08/10/2020   CREATININE 0.76 08/10/2020   CALCIUM 9.5 08/10/2020   EGFR 104 08/10/2020   GFRNONAA 124 08/07/2015   No results found for: CHOL, HDL, LDLCALC, LDLDIRECT, TRIG, CHOLHDL Lab Results  Component Value Date   TSH 3.170 09/15/2020   No results found for: HGBA1C Lab Results  Component Value Date   WBC 6.9 08/10/2020   HGB 13.7 08/10/2020   HCT 40.1 08/10/2020   MCV 95 08/10/2020   PLT 216 08/10/2020   No results found for: ALT, AST, GGT, ALKPHOS, BILITOT No results found for: 25OHVITD2, 25OHVITD3, VD25OH   Review of Systems  Constitutional:  Positive for diaphoresis, fatigue and weight gain. Negative for chills and fever.  HENT:  Negative for drooling, ear discharge, ear pain and sore throat.   Respiratory:  Negative for cough, shortness of breath and wheezing.   Cardiovascular:  Negative for chest pain, palpitations and leg swelling.  Gastrointestinal:  Positive for constipation. Negative for abdominal pain, blood in stool, diarrhea and nausea.  Endocrine: Negative for heat intolerance and polydipsia.  Genitourinary:  Negative for dysuria, frequency, hematuria, menstrual problem and urgency.  Musculoskeletal:  Negative for back pain, myalgias and neck pain.  Skin:  Negative for rash.  Allergic/Immunologic: Negative for environmental allergies.  Neurological:  Negative for dizziness and headaches.   Hematological:  Does not bruise/bleed easily.  Psychiatric/Behavioral:  Negative for suicidal ideas. The patient is nervous/anxious.    Patient Active Problem List   Diagnosis Date Noted   IUD (intrauterine device) in place 03/02/2020   Bipolar 2 disorder, major depressive episode ([place]) 11/24/2013    Allergies  Allergen Reactions   Penicillins Anaphylaxis    Past Surgical History:  Procedure Laterality Date   COLPOSCOPY     Per patient age 36   INTRAUTERINE DEVICE (IUD) INSERTION  2018    Social History   Tobacco Use   Smoking status: Former    Types: Cigarettes    Quit date: 11/19/2016    Years since quitting: 4.2   Smokeless tobacco: Never  Substance Use Topics   Alcohol use: No   Drug use: No     Medication list has been reviewed and updated.  Current Meds  Medication Sig   escitalopram (LEXAPRO) 10 MG tablet Take 10 mg by mouth daily.   lamoTRIgine (LAMICTAL) 100 MG tablet Take 100 mg by mouth 2 (two) times daily. Dr Lemmie Evens   levothyroxine (SYNTHROID) 50 MCG tablet TAKE 1 TABLET BY MOUTH EVERY DAY   prazosin (MINIPRESS) 2 MG capsule Take 2 mg by mouth at bedtime. Dr Lemmie Evens   vitamin B-12 (CYANOCOBALAMIN) 500 MCG tablet Take 500 mcg by mouth daily.    PHQ 2/9 Scores 02/17/2021 08/10/2020 07/12/2019 06/14/2019  PHQ - 2 Score 0 0 0 0  PHQ- 9 Score 0 0 0 2    GAD 7 : Generalized Anxiety Score 02/17/2021 08/10/2020 07/12/2019 06/14/2019  Nervous,  Anxious, on Edge 0 0 0 0  Control/stop worrying 0 0 0 0  Worry too much - different things 0 0 0 0  Trouble relaxing 0 0 0 1  Restless 0 0 0 0  Easily annoyed or irritable 0 0 0 2  Afraid - awful might happen 0 0 0 0  Total GAD 7 Score 0 0 0 3  Anxiety Difficulty Not difficult at all - Not difficult at all Not difficult at all    BP Readings from Last 3 Encounters:  02/17/21 120/80  08/10/20 120/80  07/12/19 122/80    Physical Exam Vitals and nursing note reviewed.  Constitutional:      Appearance: She is well-developed.   HENT:     Head: Normocephalic.     Right Ear: Tympanic membrane, ear canal and external ear normal.     Left Ear: Tympanic membrane, ear canal and external ear normal.     Nose: Nose normal.  Eyes:     General: Lids are everted, no foreign bodies appreciated. No scleral icterus.       Left eye: No foreign body or hordeolum.     Conjunctiva/sclera: Conjunctivae normal.     Right eye: Right conjunctiva is not injected.     Left eye: Left conjunctiva is not injected.     Pupils: Pupils are equal, round, and reactive to light.  Neck:     Thyroid: No thyromegaly.     Vascular: No JVD.     Trachea: No tracheal deviation.  Cardiovascular:     Rate and Rhythm: Normal rate and regular rhythm.     Heart sounds: Normal heart sounds, S1 normal and S2 normal. No murmur heard. No systolic murmur is present.  No diastolic murmur is present.    No friction rub. No gallop. No S3 or S4 sounds.  Pulmonary:     Effort: Pulmonary effort is normal. No respiratory distress.     Breath sounds: Normal breath sounds. No wheezing or rales.  Abdominal:     General: Bowel sounds are normal.     Palpations: Abdomen is soft. There is no mass.     Tenderness: There is no abdominal tenderness. There is no guarding or rebound.  Musculoskeletal:        General: No tenderness. Normal range of motion.     Cervical back: Normal range of motion and neck supple.  Lymphadenopathy:     Cervical: No cervical adenopathy.  Skin:    General: Skin is warm.     Findings: No rash.  Neurological:     Mental Status: She is alert and oriented to person, place, and time.     Cranial Nerves: No cranial nerve deficit.     Deep Tendon Reflexes: Reflexes normal.  Psychiatric:        Mood and Affect: Mood is not anxious or depressed.    Wt Readings from Last 3 Encounters:  02/17/21 211 lb (95.7 kg)  08/10/20 205 lb (93 kg)  07/12/19 180 lb (81.6 kg)    BP 120/80    Pulse 68    Ht '5\' 3"'  (1.6 m)    Wt 211 lb (95.7 kg)     LMP 02/17/2021 (Exact Date)    BMI 37.38 kg/m   Assessment and Plan:  1. Hypothyroidism, unspecified type Chronic.  Controlled.  Although less evaluation had an elevated TSH and there was an adjustment made with levothyroxine to 50 mcg.  Present dose of 50 mcg has been for  the last 6 months and patient has had multiple symptoms suggesting the thyroid may not be supplemented enough.  We will check thyroid panel with TSH along with lipid panel renal panel at this time and adjust accordingly. - Thyroid Panel With TSH - Lipid Panel With LDL/HDL Ratio - Renal Function Panel  2. BMI 37.0-37.9, adult Patient has had another 5 pound weight gain since last visit with a significant weight gain over the past 2 years.  Patient has been given diet for counting calories and I do wonder if there may be depression contribution to this.  We will recheck thyroid and see if this has not been supplemented enough but if so I have encouraged her to return to her psychiatrist and consider if we need to make adjustment on her medication. - Lipid Panel With LDL/HDL Ratio

## 2021-02-18 LAB — LIPID PANEL WITH LDL/HDL RATIO
Cholesterol, Total: 160 mg/dL (ref 100–199)
HDL: 32 mg/dL — ABNORMAL LOW (ref >39)
LDL Chol Calc (NIH): 102 mg/dL — ABNORMAL HIGH (ref 0–99)
LDL/HDL Ratio: 3.2 ratio (ref 0.0–3.2)
Triglycerides: 147 mg/dL (ref 0–149)
VLDL Cholesterol Cal: 26 mg/dL (ref 5–40)

## 2021-02-18 LAB — RENAL FUNCTION PANEL
Albumin: 4.1 g/dL (ref 3.8–4.8)
BUN/Creatinine Ratio: 16 (ref 9–23)
BUN: 12 mg/dL (ref 6–20)
CO2: 25 mmol/L (ref 20–29)
Calcium: 9.5 mg/dL (ref 8.7–10.2)
Chloride: 99 mmol/L (ref 96–106)
Creatinine, Ser: 0.75 mg/dL (ref 0.57–1.00)
Glucose: 98 mg/dL (ref 70–99)
Phosphorus: 3.4 mg/dL (ref 3.0–4.3)
Potassium: 4.4 mmol/L (ref 3.5–5.2)
Sodium: 139 mmol/L (ref 134–144)
eGFR: 105 mL/min/{1.73_m2} (ref >59)

## 2021-02-18 LAB — THYROID PANEL WITH TSH
Free Thyroxine Index: 2.1 (ref 1.2–4.9)
T3 Uptake Ratio: 26 % (ref 24–39)
T4, Total: 8.1 ug/dL (ref 4.5–12.0)
TSH: 3.2 u[IU]/mL (ref 0.450–4.500)

## 2021-03-06 ENCOUNTER — Other Ambulatory Visit: Payer: Self-pay | Admitting: Family Medicine

## 2021-03-06 DIAGNOSIS — E039 Hypothyroidism, unspecified: Secondary | ICD-10-CM

## 2021-03-08 NOTE — Telephone Encounter (Signed)
Requested Prescriptions  Pending Prescriptions Disp Refills   levothyroxine (SYNTHROID) 50 MCG tablet [Pharmacy Med Name: LEVOTHYROXINE 50 MCG TABLET] 30 tablet 6    Sig: TAKE 1 TABLET BY MOUTH EVERY DAY     Endocrinology:  Hypothyroid Agents Passed - 03/06/2021  8:30 AM      Passed - TSH in normal range and within 360 days    TSH  Date Value Ref Range Status  02/17/2021 3.200 0.450 - 4.500 uIU/mL Final         Passed - Valid encounter within last 12 months    Recent Outpatient Visits          2 weeks ago Hypothyroidism, unspecified type   [facility] Juline Patch, MD   7 months ago Hypothyroidism, unspecified type   [facility] Juline Patch, MD   1 year ago Hypothyroidism, unspecified type   [facility] Juline Patch, MD   1 year ago Establishing care with new doctor, encounter for   [facility] Juline Patch, MD   5 years ago Encounter to establish care with new doctor   [facility] Juline Patch, MD      Future Appointments            In 5 months Juline Patch, MD [facility], [facility]

## 2021-08-17 ENCOUNTER — Encounter: Payer: Self-pay | Admitting: Family Medicine

## 2021-08-17 ENCOUNTER — Ambulatory Visit: Payer: Commercial Managed Care - PPO | Admitting: Family Medicine

## 2021-08-17 VITALS — BP 120/62 | HR 68 | Ht 63.0 in | Wt 208.0 lb

## 2021-08-17 DIAGNOSIS — E039 Hypothyroidism, unspecified: Secondary | ICD-10-CM | POA: Diagnosis not present

## 2021-08-17 MED ORDER — LEVOTHYROXINE SODIUM 50 MCG PO TABS: 50.0000 ug | ORAL_TABLET | Freq: Every day | ORAL | 6 refills | Status: DC

## 2021-08-17 NOTE — Progress Notes (Signed)
Date:  08/17/2021   Name:  Emma Phillips   DOB:  September 13, 1985   MRN:  [id number]   Chief Complaint: Hypothyroidism  Thyroid Problem Presents for follow-up visit. Symptoms include anxiety, constipation, fatigue, heat intolerance, nail problem and weight gain. Patient reports no cold intolerance, depressed mood, diaphoresis, diarrhea, dry skin, hair loss, leg swelling, menstrual problem, palpitations, visual change or weight loss. The symptoms have been stable.    Lab Results  Component Value Date   NA 139 02/17/2021   K 4.4 02/17/2021   CO2 25 02/17/2021   GLUCOSE 98 02/17/2021   BUN 12 02/17/2021   CREATININE 0.75 02/17/2021   CALCIUM 9.5 02/17/2021   EGFR 105 02/17/2021   GFRNONAA 124 08/07/2015   Lab Results  Component Value Date   CHOL 160 02/17/2021   HDL 32 (L) 02/17/2021   LDLCALC 102 (H) 02/17/2021   TRIG 147 02/17/2021   Lab Results  Component Value Date   TSH 3.200 02/17/2021   No results found for: "HGBA1C" Lab Results  Component Value Date   WBC 6.9 08/10/2020   HGB 13.7 08/10/2020   HCT 40.1 08/10/2020   MCV 95 08/10/2020   PLT 216 08/10/2020   No results found for: "ALT", "AST", "GGT", "ALKPHOS", "BILITOT" No results found for: "25OHVITD2", "25OHVITD3", "VD25OH"   Review of Systems  Constitutional:  Positive for fatigue and weight gain. Negative for diaphoresis and weight loss.  Respiratory:  Negative for chest tightness, shortness of breath and wheezing.   Cardiovascular:  Negative for palpitations.  Gastrointestinal:  Positive for constipation. Negative for abdominal pain, blood in stool and diarrhea.  Endocrine: Positive for heat intolerance. Negative for cold intolerance, polydipsia and polyuria.  Genitourinary:  Negative for difficulty urinating and menstrual problem.  Psychiatric/Behavioral:  The patient is nervous/anxious.     Patient Active Problem List   Diagnosis Date Noted   IUD (intrauterine device) in place 03/02/2020   Bipolar  2 disorder, major depressive episode ([place]) 11/24/2013    Allergies  Allergen Reactions   Penicillins Anaphylaxis    Past Surgical History:  Procedure Laterality Date   COLPOSCOPY     Per patient age 27   INTRAUTERINE DEVICE (IUD) INSERTION  2018    Social History   Tobacco Use   Smoking status: Former    Types: Cigarettes    Quit date: 11/19/2016    Years since quitting: 4.7   Smokeless tobacco: Never  Substance Use Topics   Alcohol use: No   Drug use: No     Medication list has been reviewed and updated.  Current Meds  Medication Sig   buPROPion (WELLBUTRIN XL) 300 MG 24 hr tablet Take 300 mg by mouth daily. psych   lamoTRIgine (LAMICTAL) 150 MG tablet Take 150 mg by mouth 2 (two) times daily. psych   levothyroxine (SYNTHROID) 50 MCG tablet TAKE 1 TABLET BY MOUTH EVERY DAY   prazosin (MINIPRESS) 2 MG capsule Take 2 mg by mouth at bedtime. Dr Lemmie Evens   vitamin B-12 (CYANOCOBALAMIN) 500 MCG tablet Take 500 mcg by mouth daily.       08/17/2021    8:00 AM 02/17/2021    8:01 AM 08/10/2020    8:03 AM 07/12/2019    9:12 AM  GAD 7 : Generalized Anxiety Score  Nervous, Anxious, on Edge 2 0 0 0  Control/stop worrying 0 0 0 0  Worry too much - different things 0 0 0 0  Trouble relaxing 2 0 0 0  Restless 3 0 0 0  Easily annoyed or irritable 2 0 0 0  Afraid - awful might happen 1 0 0 0  Total GAD 7 Score 10 0 0 0  Anxiety Difficulty Very difficult Not difficult at all  Not difficult at all       08/17/2021    8:00 AM 02/17/2021    8:01 AM 08/10/2020    8:02 AM  Depression screen PHQ 2/9  Decreased Interest 1 0 0  Down, Depressed, Hopeless 1 0 0  PHQ - 2 Score 2 0 0  Altered sleeping 3 0 0  Tired, decreased energy 3 0 0  Change in appetite 3 0 0  Feeling bad or failure about yourself  0 0 0  Trouble concentrating 3 0 0  Moving slowly or fidgety/restless 0 0 0  Suicidal thoughts 0 0 0  PHQ-9 Score 14 0 0  Difficult doing work/chores Very difficult Not difficult at all      BP Readings from Last 3 Encounters:  08/17/21 120/62  02/17/21 120/80  08/10/20 120/80    Physical Exam Vitals and nursing note reviewed.  HENT:     Right Ear: Tympanic membrane and ear canal normal.     Left Ear: Tympanic membrane and ear canal normal.     Nose: Nose normal.  Eyes:     Pupils: Pupils are equal, round, and reactive to light.  Cardiovascular:     Rate and Rhythm: Normal rate and regular rhythm.     Pulses: Normal pulses.     Heart sounds: Normal heart sounds. No murmur heard.    No friction rub. No gallop.  Pulmonary:     Effort: Pulmonary effort is normal. No respiratory distress.     Breath sounds: Normal breath sounds. No stridor. No wheezing, rhonchi or rales.  Abdominal:     Palpations: There is no hepatomegaly or splenomegaly.     Tenderness: There is no abdominal tenderness. There is no guarding or rebound.  Neurological:     Mental Status: She is alert.     Wt Readings from Last 3 Encounters:  08/17/21 208 lb (94.3 kg)  02/17/21 211 lb (95.7 kg)  08/10/20 205 lb (93 kg)    BP 120/62   Pulse 68   Ht _0  (1.6 m)   Wt 208 lb (94.3 kg)   BMI 36.85 kg/m   Assessment and Plan: 1. Hypothyroidism, unspecified type Chronic.  Controlled.  Stable.  Multiple symptoms therefore patient will hold on refilling her levothyroxine until I contact her with current TSH information.  Anticipate staying at levothyroxine 50 mcg once a day.  We will recheck in 6 months. - levothyroxine (SYNTHROID) 50 MCG tablet; Take 1 tablet (50 mcg total) by mouth daily.  Dispense: 30 tablet; Refill: 6 - TSH

## 2021-08-18 LAB — TSH: TSH: 4.26 u[IU]/mL (ref 0.450–4.500)

## 2021-09-02 ENCOUNTER — Emergency Department: Payer: Commercial Managed Care - PPO

## 2021-09-02 ENCOUNTER — Inpatient Hospital Stay
Admission: EM | Admit: 2021-09-02 | Discharge: 2021-09-04 | DRG: 494 | Disposition: A | Discharge status: Home Health | Payer: Commercial Managed Care - PPO | Attending: Orthopaedic Surgery | Admitting: Orthopaedic Surgery

## 2021-09-02 ENCOUNTER — Encounter: Payer: Self-pay | Admitting: *Deleted

## 2021-09-02 ENCOUNTER — Inpatient Hospital Stay: Payer: Commercial Managed Care - PPO

## 2021-09-02 ENCOUNTER — Other Ambulatory Visit: Payer: Self-pay

## 2021-09-02 DIAGNOSIS — S82241A Displaced spiral fracture of shaft of right tibia, initial encounter for closed fracture: Secondary | ICD-10-CM | POA: Diagnosis present

## 2021-09-02 DIAGNOSIS — Z87891 Personal history of nicotine dependence: Secondary | ICD-10-CM | POA: Diagnosis not present

## 2021-09-02 DIAGNOSIS — S82454A Nondisplaced comminuted fracture of shaft of right fibula, initial encounter for closed fracture: Secondary | ICD-10-CM

## 2021-09-02 DIAGNOSIS — Y9351 Activity, roller skating (inline) and skateboarding: Secondary | ICD-10-CM

## 2021-09-02 DIAGNOSIS — E039 Hypothyroidism, unspecified: Secondary | ICD-10-CM | POA: Diagnosis present

## 2021-09-02 DIAGNOSIS — S82251A Displaced comminuted fracture of shaft of right tibia, initial encounter for closed fracture: Principal | ICD-10-CM

## 2021-09-02 DIAGNOSIS — Z8249 Family history of ischemic heart disease and other diseases of the circulatory system: Secondary | ICD-10-CM | POA: Diagnosis not present

## 2021-09-02 DIAGNOSIS — Y92331 Roller skating rink as the place of occurrence of the external cause: Secondary | ICD-10-CM

## 2021-09-02 DIAGNOSIS — Z803 Family history of malignant neoplasm of breast: Secondary | ICD-10-CM | POA: Diagnosis not present

## 2021-09-02 DIAGNOSIS — Z8349 Family history of other endocrine, nutritional and metabolic diseases: Secondary | ICD-10-CM

## 2021-09-02 DIAGNOSIS — Z79899 Other long term (current) drug therapy: Secondary | ICD-10-CM | POA: Diagnosis not present

## 2021-09-02 DIAGNOSIS — Z79890 Hormone replacement therapy (postmenopausal): Secondary | ICD-10-CM | POA: Diagnosis not present

## 2021-09-02 DIAGNOSIS — S82451A Displaced comminuted fracture of shaft of right fibula, initial encounter for closed fracture: Secondary | ICD-10-CM | POA: Diagnosis present

## 2021-09-02 DIAGNOSIS — Z6836 Body mass index (BMI) 36.0-36.9, adult: Secondary | ICD-10-CM | POA: Diagnosis not present

## 2021-09-02 DIAGNOSIS — F319 Bipolar disorder, unspecified: Secondary | ICD-10-CM | POA: Diagnosis present

## 2021-09-02 DIAGNOSIS — E669 Obesity, unspecified: Secondary | ICD-10-CM | POA: Diagnosis present

## 2021-09-02 MED ORDER — MORPHINE SULFATE (PF) 2 MG/ML IV SOLN
0.5000 mg | INTRAVENOUS | Status: DC | PRN
  Administered 2021-09-02: 0.5 mg via INTRAVENOUS
  Filled 2021-09-02: qty 1

## 2021-09-02 MED ORDER — HYDROMORPHONE HCL 1 MG/ML IJ SOLN
1.0000 mg | Freq: Once | INTRAMUSCULAR | Status: AC
  Administered 2021-09-02: 1 mg via INTRAVENOUS
  Filled 2021-09-02: qty 1

## 2021-09-02 MED ORDER — BISACODYL 5 MG PO TBEC: 5.0000 mg | DELAYED_RELEASE_TABLET | Freq: Every day | ORAL | Status: DC | PRN

## 2021-09-02 MED ORDER — SENNA 8.6 MG PO TABS
1.0000 | ORAL_TABLET | Freq: Two times a day (BID) | ORAL | Status: DC
  Administered 2021-09-04: 8.6 mg via ORAL
  Filled 2021-09-02: qty 1

## 2021-09-02 MED ORDER — POLYETHYLENE GLYCOL 3350 17 G PO PACK: 17.0000 g | PACK | Freq: Every day | ORAL | Status: DC | PRN

## 2021-09-02 MED ORDER — ONDANSETRON HCL 4 MG/2ML IJ SOLN
4.0000 mg | Freq: Once | INTRAMUSCULAR | Status: AC
Start: 2021-09-02 — End: 2021-09-02
  Administered 2021-09-02: 4 mg via INTRAVENOUS
  Filled 2021-09-02: qty 2

## 2021-09-02 MED ORDER — METHOCARBAMOL 1000 MG/10ML IJ SOLN: 500.0000 mg | Freq: Four times a day (QID) | INTRAVENOUS | Status: DC | PRN

## 2021-09-02 MED ORDER — HYDROCODONE-ACETAMINOPHEN 5-325 MG PO TABS
1.0000 | ORAL_TABLET | Freq: Four times a day (QID) | ORAL | Status: DC | PRN
  Administered 2021-09-03: 2 via ORAL
  Filled 2021-09-02 (×2): qty 2

## 2021-09-02 MED ORDER — SODIUM CHLORIDE 0.9 % IV SOLN: INTRAVENOUS | Status: DC

## 2021-09-02 MED ORDER — METHOCARBAMOL 500 MG PO TABS
500.0000 mg | ORAL_TABLET | Freq: Four times a day (QID) | ORAL | Status: DC | PRN
  Administered 2021-09-04: 500 mg via ORAL
  Filled 2021-09-02: qty 1

## 2021-09-02 NOTE — ED Triage Notes (Signed)
[place] via ems [place] skating [place].  Pt fell skating and has right lower leg pain.  Iv in place.  fentanyl given by ems.  Pt in recliner.  Pt alert.

## 2021-09-02 NOTE — ED Provider Notes (Signed)
[facility] Provider Note  Patient Contact: 8:44 PM (approximate)   History   Leg Injury   HPI  Emma Phillips is a 36 y.o. female who presents the emergency department complaining of ankle injury.  Patient was ice-skating when her leg started to slide out from underneath her.  Patient states that her body went 1 week, her leg and ankle went the other way.  Patient heard and saw a deformity with large pop.  Patient states that she could see the bone tenting her skin along the lateral ankle joint.  Patient was brought in via EMS, did receive fentanyl in route.  Pulses were identified by EMS and were marked accordingly.  Sensation was preserved to the toes according to EMS.  Patient denies any other injury or complaint.  Denies any open wounds.     Physical Exam   Triage Vital Signs: ED Triage Vitals  Enc Vitals Group     BP 09/02/21 2026 139/65     Pulse Rate 09/02/21 2026 70     Resp 09/02/21 2026 20     Temp 09/02/21 2026 98.9 F (37.2 C)     Temp Source 09/02/21 2026 Oral     SpO2 09/02/21 2026 97 %     Weight 09/02/21 2023 205 lb (93 kg)     Height 09/02/21 2023 5\' 3"  (1.6 m)     Head Circumference --      Peak Flow --      Pain Score 09/02/21 2023 8     Pain Loc --      Pain Edu? --      Excl. in GC? --     Most recent vital signs: Vitals:   09/02/21 2026  BP: 139/65  Pulse: 70  Resp: 20  Temp: 98.9 F (37.2 C)  SpO2: 97%     General: Alert and in no acute distress.   Cardiovascular:  Good peripheral perfusion Respiratory: Normal respiratory effort without tachypnea or retractions. Lungs CTAB.  Musculoskeletal: Full range of motion to all extremities.  Patient with edema and possible deformity along the lateral ankle joint.  No open wounds identified.  Significant tenderness globally about the ankle joint.  It appears that there may be some palpable deformity along the medial and lateral malleolus.  Dorsalis pedis pulse is marked  and is appreciated on exam.  Sensation intact all digits. Neurologic:  No gross focal neurologic deficits are appreciated.  Skin:   No rash noted Other:   ED Results / Procedures / Treatments   Labs (all labs ordered are listed, but only abnormal results are displayed) Labs Reviewed  HIV ANTIBODY (ROUTINE TESTING W REFLEX)  BASIC METABOLIC PANEL  CBC     EKG     RADIOLOGY  I personally viewed, evaluated, and interpreted these images as part of my medical decision making, as well as reviewing the written report by the radiologist.  ED Provider Interpretation: Evaluation of imaging reveals tibial foot fracture with comminution and slight displacement.  Patient also has a fibular fracture of the shaft with comminution  Chest Portable 1 View  Result Date: 09/02/2021 CLINICAL DATA:  Preop tib fib fracture. EXAM: PORTABLE CHEST 1 VIEW COMPARISON:  None Available. FINDINGS: The cardiomediastinal contours are normal. There is mild peribronchial thickening. Pulmonary vasculature is normal. No consolidation, pleural effusion, or pneumothorax. No acute osseous abnormalities are seen. IMPRESSION: Mild peribronchial thickening, can be seen with asthma or bronchitis. Electronically Signed   By: 09/04/2021  Sanford M.D.   On: 09/02/2021 23:19   DG Tibia/Fibula Right  Result Date: 09/02/2021 CLINICAL DATA:  Recent I skating injury with lower leg pain, initial encounter EXAM: RIGHT TIBIA AND FIBULA - 2 VIEW COMPARISON:  None Available. FINDINGS: Comminuted fractures of the proximal fibular diaphysis and distal tibial diaphysis are seen. Mild angulation of the fractures is noted posteriorly. No significant soft tissue abnormality is noted. No other focal abnormality is noted. IMPRESSION: Comminuted fractures of the tibia and fibula as described. Electronically Signed   By: Alcide Clever M.D.   On: 09/02/2021 21:32    PROCEDURES:  Critical Care performed: No  .Splint Application  Date/Time:  09/02/2021 11:48 PM  Performed by: Racheal Patches, PA-C Authorized by: Racheal Patches, PA-C   Consent:    Consent obtained:  Verbal   Consent given by:  Patient   Risks discussed:  Discoloration, pain and swelling Universal protocol:    Procedure explained and questions answered to patient or proxy's satisfaction: yes     Immediately prior to procedure a time out was called: yes     Patient identity confirmed:  Verbally with patient Pre-procedure details:    Distal neurologic exam:  Normal   Distal perfusion: distal pulses strong   Procedure details:    Location:  Leg   Leg location:  R lower leg   Splint type:  Long leg   Supplies:  Cotton padding, fiberglass and elastic bandage Post-procedure details:    Distal neurologic exam:  Normal   Distal perfusion: distal pulses strong     Procedure completion:  Tolerated well, no immediate complications   Post-procedure imaging: not applicable      MEDICATIONS ORDERED IN ED: Medications  HYDROcodone-acetaminophen (NORCO/VICODIN) 5-325 MG per tablet 1-2 tablet (has no administration in time range)  morphine (PF) 2 MG/ML injection 0.5 mg (0.5 mg Intravenous Given 09/02/21 2336)  0.9 %  sodium chloride infusion (has no administration in time range)  senna (SENOKOT) tablet 8.6 mg (has no administration in time range)  polyethylene glycol (MIRALAX / GLYCOLAX) packet 17 g (has no administration in time range)  bisacodyl (DULCOLAX) EC tablet 5 mg (has no administration in time range)  methocarbamol (ROBAXIN) tablet 500 mg (has no administration in time range)    Or  methocarbamol (ROBAXIN) 500 mg in dextrose 5 % 50 mL IVPB (has no administration in time range)  HYDROmorphone (DILAUDID) injection 1 mg (1 mg Intravenous Given 09/02/21 2121)  ondansetron (ZOFRAN) injection 4 mg (4 mg Intravenous Given 09/02/21 2120)     IMPRESSION / MDM / ASSESSMENT AND PLAN / ED COURSE  I reviewed the triage vital signs and the nursing notes.                               Differential diagnosis includes, but is not limited to, contusion, fracture, sprain  Patient's presentation is most consistent with acute presentation with potential threat to life or bodily function.   Patient's diagnosis is consistent with tibial and fibular fractures.  Patient presented to the ED after an ice-skating accident.  Patient states that her body went 1 way, her leg went the other.  She felt and heard a pop and stated that she saw a visible changes to her leg with bony tenting of the skin.  There was no penetration through the skin and skin is intact at this time.  Patient was in significant amounts of pain  and has been treated with pain medication both via EMS and here in the ED.  X-ray revealed comminuted fractures of the tibia and fibula.  I discussed the patient with the orthopedic surgeon, Dr. Okey Dupre.  Initially Crawford recommended transfer due to scheduling issues with the OR.  After discussion with my attending provider, Dr. Marisa Severin, we discussed transfer again with Dr. Okey Dupre.  After further discussions Dr. Okey Dupre will admit the patient to his service for surgical repair.   Patient's leg is splinted with long-leg posterior splint at 20 degrees of flexion of the knee.        FINAL CLINICAL IMPRESSION(S) / ED DIAGNOSES   Final diagnoses:  Closed displaced comminuted fracture of shaft of right tibia, initial encounter  Closed nondisplaced comminuted fracture of shaft of right fibula, initial encounter     Rx / DC Orders   ED Discharge Orders     None        Note:  This document was prepared using Dragon voice recognition software and may include unintentional dictation errors.   Racheal Patches, PA-C 09/02/21 2349    Dionne Bucy, MD 09/02/21 [phone]

## 2021-09-02 NOTE — Care Plan (Signed)
Orthopedic Care Plan Note  Patient is a 36yo F found to have a displaced right distal third tibia shaft fracture after a fall ice skating  - Long leg splint in ED for comfort - Admit to orthopedics inpatient status - Will require surgery- Right tibia IMN - NPO after midnight, will determine exact surgical timing tomorrow, 8/18 - Full H&P note will be written 8/18  Ross Marcus

## 2021-09-03 ENCOUNTER — Inpatient Hospital Stay: Payer: Commercial Managed Care - PPO | Admitting: Anesthesiology

## 2021-09-03 ENCOUNTER — Encounter
Admission: EM | Disposition: A | Discharge status: Home Health | Payer: Self-pay | Source: Home / Self Care | Attending: Orthopaedic Surgery

## 2021-09-03 ENCOUNTER — Inpatient Hospital Stay: Payer: Commercial Managed Care - PPO

## 2021-09-03 HISTORY — PX: TIBIA IM NAIL INSERTION: SHX2516

## 2021-09-03 LAB — CBC
HCT: 35 % — ABNORMAL LOW (ref 36.0–46.0)
Hemoglobin: 11.8 g/dL — ABNORMAL LOW (ref 12.0–15.0)
MCH: 30.6 pg (ref 26.0–34.0)
MCHC: 33.7 g/dL (ref 30.0–36.0)
MCV: 90.7 fL (ref 80.0–100.0)
Platelets: 244 10*3/uL (ref 150–400)
RBC: 3.86 MIL/uL — ABNORMAL LOW (ref 3.87–5.11)
RDW: 12.5 % (ref 11.5–15.5)
WBC: 8.7 10*3/uL (ref 4.0–10.5)
nRBC: 0 % (ref 0.0–0.2)

## 2021-09-03 LAB — BASIC METABOLIC PANEL
Anion gap: 7 (ref 5–15)
BUN: 14 mg/dL (ref 6–20)
CO2: 25 mmol/L (ref 22–32)
Calcium: 8.9 mg/dL (ref 8.9–10.3)
Chloride: 107 mmol/L (ref 98–111)
Creatinine, Ser: 0.83 mg/dL (ref 0.44–1.00)
GFR, Estimated: >60 mL/min (ref >60)
Glucose, Bld: 110 mg/dL — ABNORMAL HIGH (ref 70–99)
Potassium: 3.9 mmol/L (ref 3.5–5.1)
Sodium: 139 mmol/L (ref 135–145)

## 2021-09-03 SURGERY — INSERTION, INTRAMEDULLARY ROD, TIBIA
Anesthesia: General | Laterality: Right

## 2021-09-03 MED ORDER — MORPHINE SULFATE (PF) 2 MG/ML IV SOLN: 0.5000 mg | INTRAVENOUS | Status: DC | PRN

## 2021-09-03 MED ORDER — ONDANSETRON HCL 4 MG/2ML IJ SOLN
4.0000 mg | Freq: Three times a day (TID) | INTRAMUSCULAR | Status: DC | PRN
Start: 2021-09-03 — End: 2021-09-04

## 2021-09-03 MED ORDER — ONDANSETRON HCL 4 MG/2ML IJ SOLN
INTRAMUSCULAR | Status: AC
  Filled 2021-09-03: qty 2

## 2021-09-03 MED ORDER — ONDANSETRON HCL 4 MG/2ML IJ SOLN: 4.0000 mg | Freq: Four times a day (QID) | INTRAMUSCULAR | Status: DC | PRN

## 2021-09-03 MED ORDER — DEXAMETHASONE SODIUM PHOSPHATE 10 MG/ML IJ SOLN
INTRAMUSCULAR | Status: AC
  Filled 2021-09-03: qty 1

## 2021-09-03 MED ORDER — ONDANSETRON HCL 4 MG/2ML IJ SOLN
4.0000 mg | Freq: Four times a day (QID) | INTRAMUSCULAR | Status: DC | PRN
Start: 2021-09-03 — End: 2021-09-04

## 2021-09-03 MED ORDER — LIDOCAINE HCL (PF) 2 % IJ SOLN
INTRAMUSCULAR | Status: DC | PRN
  Administered 2021-09-03: 100 mg

## 2021-09-03 MED ORDER — BUPROPION HCL ER (XL) 150 MG PO TB24
300.0000 mg | ORAL_TABLET | Freq: Every day | ORAL | Status: DC
  Administered 2021-09-04: 300 mg via ORAL
  Filled 2021-09-03: qty 2

## 2021-09-03 MED ORDER — ASPIRIN 325 MG PO TBEC
325.0000 mg | DELAYED_RELEASE_TABLET | Freq: Two times a day (BID) | ORAL | Status: DC
  Administered 2021-09-04: 325 mg via ORAL
  Filled 2021-09-03: qty 1

## 2021-09-03 MED ORDER — MIDAZOLAM HCL 2 MG/2ML IJ SOLN
INTRAMUSCULAR | Status: AC
  Filled 2021-09-03: qty 2

## 2021-09-03 MED ORDER — ACETAMINOPHEN 500 MG PO TABS
500.0000 mg | ORAL_TABLET | Freq: Four times a day (QID) | ORAL | Status: DC
  Administered 2021-09-04: 500 mg via ORAL
  Filled 2021-09-03: qty 1

## 2021-09-03 MED ORDER — FENTANYL CITRATE (PF) 100 MCG/2ML IJ SOLN
INTRAMUSCULAR | Status: AC
  Filled 2021-09-03: qty 2

## 2021-09-03 MED ORDER — VANCOMYCIN HCL IN DEXTROSE 1-5 GM/200ML-% IV SOLN
1000.0000 mg | Freq: Two times a day (BID) | INTRAVENOUS | Status: AC
  Administered 2021-09-03 – 2021-09-04 (×2): 1000 mg via INTRAVENOUS
  Filled 2021-09-03 (×2): qty 200

## 2021-09-03 MED ORDER — GLYCOPYRROLATE 0.2 MG/ML IJ SOLN
INTRAMUSCULAR | Status: AC
  Filled 2021-09-03: qty 1

## 2021-09-03 MED ORDER — GLYCOPYRROLATE 0.2 MG/ML IJ SOLN
INTRAMUSCULAR | Status: DC | PRN
  Administered 2021-09-03: .2 mg via INTRAVENOUS

## 2021-09-03 MED ORDER — METOCLOPRAMIDE HCL 5 MG/ML IJ SOLN: 5.0000 mg | Freq: Three times a day (TID) | INTRAMUSCULAR | Status: DC | PRN

## 2021-09-03 MED ORDER — DEXMEDETOMIDINE (PRECEDEX) IN NS 20 MCG/5ML (4 MCG/ML) IV SYRINGE
PREFILLED_SYRINGE | INTRAVENOUS | Status: DC | PRN
  Administered 2021-09-03: 12 ug via INTRAVENOUS
  Administered 2021-09-03: 8 ug via INTRAVENOUS

## 2021-09-03 MED ORDER — PROPOFOL 10 MG/ML IV BOLUS
INTRAVENOUS | Status: DC | PRN
  Administered 2021-09-03: 200 mg via INTRAVENOUS

## 2021-09-03 MED ORDER — LIDOCAINE HCL 1 % IJ SOLN
INTRAMUSCULAR | Status: DC | PRN
  Administered 2021-09-04: 30 mL

## 2021-09-03 MED ORDER — ONDANSETRON HCL 4 MG/2ML IJ SOLN
INTRAMUSCULAR | Status: DC | PRN
  Administered 2021-09-03: 4 mg via INTRAVENOUS

## 2021-09-03 MED ORDER — ACETAMINOPHEN 10 MG/ML IV SOLN
INTRAVENOUS | Status: AC
  Filled 2021-09-03: qty 100

## 2021-09-03 MED ORDER — VANCOMYCIN HCL IN DEXTROSE 1-5 GM/200ML-% IV SOLN
INTRAVENOUS | Status: AC
  Filled 2021-09-03: qty 200

## 2021-09-03 MED ORDER — ADULT MULTIVITAMIN W/MINERALS CH
1.0000 | ORAL_TABLET | Freq: Every day | ORAL | Status: DC
Start: 2021-09-04 — End: 2021-09-04
  Administered 2021-09-04: 1 via ORAL
  Filled 2021-09-03: qty 1

## 2021-09-03 MED ORDER — DOCUSATE SODIUM 100 MG PO CAPS
100.0000 mg | ORAL_CAPSULE | Freq: Two times a day (BID) | ORAL | Status: DC
  Administered 2021-09-04: 100 mg via ORAL
  Filled 2021-09-03: qty 1

## 2021-09-03 MED ORDER — FENTANYL CITRATE (PF) 100 MCG/2ML IJ SOLN
INTRAMUSCULAR | Status: DC | PRN
  Administered 2021-09-03 (×4): 50 ug via INTRAVENOUS

## 2021-09-03 MED ORDER — MORPHINE SULFATE (PF) 2 MG/ML IV SOLN
1.0000 mg | INTRAVENOUS | Status: DC | PRN
  Administered 2021-09-03 (×5): 1 mg via INTRAVENOUS
  Filled 2021-09-03 (×6): qty 1

## 2021-09-03 MED ORDER — LACTATED RINGERS IV SOLN: INTRAVENOUS | Status: DC

## 2021-09-03 MED ORDER — ONDANSETRON HCL 4 MG PO TABS: 4.0000 mg | ORAL_TABLET | Freq: Four times a day (QID) | ORAL | Status: DC | PRN

## 2021-09-03 MED ORDER — METOCLOPRAMIDE HCL 5 MG PO TABS: 5.0000 mg | ORAL_TABLET | Freq: Three times a day (TID) | ORAL | Status: DC | PRN

## 2021-09-03 MED ORDER — HYDROCODONE-ACETAMINOPHEN 5-325 MG PO TABS
1.0000 | ORAL_TABLET | ORAL | Status: DC | PRN
  Administered 2021-09-04 (×3): 2 via ORAL
  Filled 2021-09-03 (×2): qty 2

## 2021-09-03 MED ORDER — HYDROCODONE-ACETAMINOPHEN 5-325 MG PO TABS: 1.0000 | ORAL_TABLET | ORAL | 0 refills | Status: DC | PRN

## 2021-09-03 MED ORDER — DEXAMETHASONE SODIUM PHOSPHATE 10 MG/ML IJ SOLN
INTRAMUSCULAR | Status: DC | PRN
  Administered 2021-09-03: 10 mg via INTRAVENOUS

## 2021-09-03 MED ORDER — ENSURE ENLIVE PO LIQD
237.0000 mL | Freq: Two times a day (BID) | ORAL | Status: DC
Start: 2021-09-04 — End: 2021-09-04

## 2021-09-03 MED ORDER — MIDAZOLAM HCL 5 MG/5ML IJ SOLN
INTRAMUSCULAR | Status: DC | PRN
  Administered 2021-09-03: 2 mg via INTRAVENOUS

## 2021-09-03 MED ORDER — PHENOL 1.4 % MT LIQD: 1.0000 | OROMUCOSAL | Status: DC | PRN

## 2021-09-03 MED ORDER — MENTHOL 3 MG MT LOZG
1.0000 | LOZENGE | OROMUCOSAL | Status: DC | PRN
  Filled 2021-09-03: qty 9

## 2021-09-03 MED ORDER — VANCOMYCIN HCL 1500 MG/300ML IV SOLN: 1500.0000 mg | INTRAVENOUS | Status: DC

## 2021-09-03 MED ORDER — HYDROCODONE-ACETAMINOPHEN 7.5-325 MG PO TABS: 1.0000 | ORAL_TABLET | ORAL | Status: DC | PRN

## 2021-09-03 MED ORDER — PROPOFOL 10 MG/ML IV BOLUS
INTRAVENOUS | Status: AC
  Filled 2021-09-03: qty 20

## 2021-09-03 MED ORDER — VANCOMYCIN HCL IN DEXTROSE 1-5 GM/200ML-% IV SOLN: 1000.0000 mg | Freq: Two times a day (BID) | INTRAVENOUS | Status: DC

## 2021-09-03 MED ORDER — ACETAMINOPHEN 325 MG PO TABS: 325.0000 mg | ORAL_TABLET | Freq: Four times a day (QID) | ORAL | Status: DC | PRN

## 2021-09-03 MED ORDER — LIDOCAINE HCL (PF) 2 % IJ SOLN
INTRAMUSCULAR | Status: AC
  Filled 2021-09-03: qty 5

## 2021-09-03 MED ORDER — ACETAMINOPHEN 10 MG/ML IV SOLN
INTRAVENOUS | Status: DC | PRN
  Administered 2021-09-03: 1000 mg via INTRAVENOUS

## 2021-09-03 SURGICAL SUPPLY — 49 items
BIT DRILL LONG 4.0 (BIT) IMPLANT
BIT DRILL SHORT 4.0 (BIT) IMPLANT
BLADE SURG 15 STRL LF DISP TIS (BLADE) IMPLANT
BLADE SURG 15 STRL SS (BLADE) ×1
BNDG COHESIVE 6X5 TAN ST LF (GAUZE/BANDAGES/DRESSINGS) ×3 IMPLANT
BNDG ESMARK 6X12 TAN STRL LF (GAUZE/BANDAGES/DRESSINGS) IMPLANT
CHLORAPREP W/TINT 26 (MISCELLANEOUS) ×1 IMPLANT
DRAPE 3/4 80X56 (DRAPES) ×2 IMPLANT
DRAPE C-ARM 42X72 X-RAY (DRAPES) ×1 IMPLANT
DRAPE SURG 17X11 SM STRL (DRAPES) ×2 IMPLANT
DRILL BIT LONG 4.0 (BIT) ×1
DRILL BIT SHORT 4.0 (BIT) ×2
ELECT REM PT RETURN 9FT ADLT (ELECTROSURGICAL) ×1
ELECTRODE REM PT RTRN 9FT ADLT (ELECTROSURGICAL) ×1 IMPLANT
GAUZE SPONGE 4X4 12PLY STRL (GAUZE/BANDAGES/DRESSINGS) ×1 IMPLANT
GAUZE XEROFORM 1X8 LF (GAUZE/BANDAGES/DRESSINGS) ×1 IMPLANT
GLOVE BIO SURGEON STRL SZ7.5 (GLOVE) ×1 IMPLANT
GLOVE SURG UNDER POLY LF SZ7.5 (GLOVE) ×1 IMPLANT
GOWN STRL REUS W/ TWL XL LVL3 (GOWN DISPOSABLE) ×1 IMPLANT
GOWN STRL REUS W/TWL XL LVL3 (GOWN DISPOSABLE) ×1
GOWN STRL REUS W/TWL XL LVL4 (GOWN DISPOSABLE) ×1 IMPLANT
GUIDE PIN 3.2X343 (PIN) ×1
GUIDE PIN 3.2X343MM (PIN) ×1
GUIDE ROD 3.0 (MISCELLANEOUS) ×1
HEMOVAC 400CC 10FR (MISCELLANEOUS) IMPLANT
KIT TURNOVER CYSTO (KITS) ×1 IMPLANT
MANIFOLD NEPTUNE II (INSTRUMENTS) ×1 IMPLANT
MAT ABSORB  FLUID 56X50 GRAY (MISCELLANEOUS) ×1
MAT ABSORB FLUID 56X50 GRAY (MISCELLANEOUS) ×1 IMPLANT
NAIL TIBIAL TRIGEN META 8.5X31 (Nail) IMPLANT
NS IRRIG 1000ML POUR BTL (IV SOLUTION) ×1 IMPLANT
PACK HIP COMPR (MISCELLANEOUS) ×1 IMPLANT
PAD ABD DERMACEA PRESS 5X9 (GAUZE/BANDAGES/DRESSINGS) ×1 IMPLANT
PAD ARMBOARD 7.5X6 YLW CONV (MISCELLANEOUS) ×1 IMPLANT
PIN GUIDE 3.2X343MM (PIN) IMPLANT
ROD GUIDE 3.0 (MISCELLANEOUS) IMPLANT
SCREW TRIGEN LOW PROF 4.5X27.5 (Screw) IMPLANT
SCREW TRIGEN LOW PROF 4.5X35 (Screw) IMPLANT
SCREW TRIGEN LOW PROF 4.5X47.5 (Screw) IMPLANT
SUCTION FRAZIER HANDLE 10FR (MISCELLANEOUS) ×1
SUCTION TUBE FRAZIER 10FR DISP (MISCELLANEOUS) ×1 IMPLANT
SUT VIC AB 0 CT1 36 (SUTURE) ×2 IMPLANT
SUT VIC AB 2-0 CT1 (SUTURE) ×1 IMPLANT
SUT VIC AB 2-0 CT1 27 (SUTURE) ×1
SUT VIC AB 2-0 CT1 TAPERPNT 27 (SUTURE) ×1 IMPLANT
SYR 30ML LL (SYRINGE) ×1 IMPLANT
TAPE PAPER 1/2X10 TAN MEDIPORE (MISCELLANEOUS) ×1 IMPLANT
TRAP FLUID SMOKE EVACUATOR (MISCELLANEOUS) ×1 IMPLANT
WATER STERILE IRR 500ML POUR (IV SOLUTION) ×1 IMPLANT

## 2021-09-03 NOTE — Anesthesia Preprocedure Evaluation (Signed)
Anesthesia Evaluation  Patient identified by MRN, date of birth, ID band Patient awake    Reviewed: Allergy & Precautions, NPO status , Patient's Chart, lab work & pertinent test results  Airway Mallampati: III  TM Distance: >3 FB Neck ROM: full    Dental no notable dental hx.    Pulmonary neg pulmonary ROS, former smoker (quit 2018),    Pulmonary exam normal        Cardiovascular negative cardio ROS Normal cardiovascular exam  ECG 09/03/21:  Normal sinus rhythm Low voltage QRS   Neuro/Psych PSYCHIATRIC DISORDERS Bipolar Disorder negative neurological ROS     GI/Hepatic negative GI ROS, Neg liver ROS,   Endo/Other  Hypothyroidism Obesity   Renal/GU      Musculoskeletal   Abdominal (+) + obese,   Peds  Hematology negative hematology ROS (+)   Anesthesia Other Findings Past Medical History: No date: Bipolar 1 disorder (HCC) No date: Insomnia  Past Surgical History: No date: COLPOSCOPY     Comment:  Per patient age 9 2018: INTRAUTERINE DEVICE (IUD) INSERTION  BMI    Body Mass Index: 36.31 kg/m      Reproductive/Obstetrics negative OB ROS                            Anesthesia Physical Anesthesia Plan  ASA: 2  Anesthesia Plan: General ETT   Post-op Pain Management: Minimal or no pain anticipated   Induction: Intravenous  PONV Risk Score and Plan: Ondansetron, Dexamethasone and Midazolam  Airway Management Planned: Oral ETT  Additional Equipment:   Intra-op Plan:   Post-operative Plan: Extubation in OR  Informed Consent: I have reviewed the patients History and Physical, chart, labs and discussed the procedure including the risks, benefits and alternatives for the proposed anesthesia with the patient or authorized representative who has indicated his/her understanding and acceptance.     Dental Advisory Given  Plan Discussed with: Anesthesiologist, CRNA and  Surgeon  Anesthesia Plan Comments:         Anesthesia Quick Evaluation

## 2021-09-03 NOTE — Anesthesia Procedure Notes (Signed)
Procedure Name: LMA Insertion Date/Time: 09/03/2021 10:07 PM  Performed by: Mathews Argyle, CRNAPre-anesthesia Checklist: Patient identified, Patient being monitored, Timeout performed, Emergency Drugs available and Suction available Patient Re-evaluated:Patient Re-evaluated prior to induction Oxygen Delivery Method: Circle system utilized Preoxygenation: Pre-oxygenation with 100% oxygen Induction Type: IV induction Ventilation: Mask ventilation without difficulty LMA: LMA inserted LMA Size: 4.0 Tube type: Oral Number of attempts: 1 Placement Confirmation: positive ETCO2 and breath sounds checked- equal and bilateral Tube secured with: Tape Dental Injury: Teeth and Oropharynx as per pre-operative assessment

## 2021-09-03 NOTE — ED Notes (Signed)
Informed RN bed assigned 

## 2021-09-03 NOTE — ED Notes (Signed)
Pt brought to ED rm 35 at this time, this RN now assuming care. 

## 2021-09-03 NOTE — H&P (Signed)
ORTHOPAEDIC CONSULTATION  REQUESTING PHYSICIAN: Ross Marcus, MD  Chief Complaint: Right leg pain  HPI: Emma Phillips is a 36 y.o. female who complains of right leg pain after a fall while rollerskating. The pain is sharp in character. The pain is worse with movement and better with rest. Denies any numbness, tingling or constitutional symptoms.  X-rays in the ER showed a displaced distal third tibia shaft fracture with associated proximal fibula fracture.  Patient was placed into a long-leg splint and was admitted for operative fixation.  Past medical history includes bipolar disorder. She lives at home with her husband and 2 kids.  Past Medical History:  Diagnosis Date   Bipolar 1 disorder (HCC)    Insomnia    Past Surgical History:  Procedure Laterality Date   COLPOSCOPY     Per patient age 64   INTRAUTERINE DEVICE (IUD) INSERTION  2018   Social History   Socioeconomic History   Marital status: Married    Spouse name: Not on file   Number of children: Not on file   Years of education: Not on file   Highest education level: Not on file  Occupational History   Not on file  Tobacco Use   Smoking status: Former    Types: Cigarettes    Quit date: 11/19/2016    Years since quitting: 4.7   Smokeless tobacco: Never  Substance and Sexual Activity   Alcohol use: No   Drug use: No   Sexual activity: Yes    Partners: Male    Birth control/protection: I.U.D.  Other Topics Concern   Not on file  Social History Narrative   Not on file   Social Determinants of Health   Financial Resource Strain: Not on file  Food Insecurity: Not on file  Transportation Needs: Not on file  Physical Activity: Not on file  Stress: Not on file  Social Connections: Not on file   Family History  Problem Relation Age of Onset   Cancer Father    Thyroid disease Mother    Breast cancer Paternal Grandmother    Cancer Paternal Grandmother    Cancer Maternal Grandmother    Heart  disease Maternal Grandfather    Allergies  Allergen Reactions   Penicillins Anaphylaxis   Prior to Admission medications   Medication Sig Start Date End Date Taking? Authorizing Provider  b complex vitamins capsule Take 1 capsule by mouth daily.   Yes [provider]  buPROPion (WELLBUTRIN XL) 300 MG 24 hr tablet Take 300 mg by mouth daily. psych 07/29/21  Yes [provider]  lamoTRIgine (LAMICTAL) 150 MG tablet Take 150 mg by mouth 2 (two) times daily. psych 08/03/21  Yes [provider]  levothyroxine (SYNTHROID) 50 MCG tablet Take 1 tablet (50 mcg total) by mouth daily. 08/17/21  Yes Duanne Limerick, MD  prazosin (MINIPRESS) 2 MG capsule Take 2 mg by mouth at bedtime. Dr Rexene Edison 02/06/21  Yes [provider]  vitamin B-12 (CYANOCOBALAMIN) 500 MCG tablet Take 500 mcg by mouth daily.   Yes [provider]   Chest Portable 1 View  Result Date: 09/02/2021 CLINICAL DATA:  Preop tib fib fracture. EXAM: PORTABLE CHEST 1 VIEW COMPARISON:  None Available. FINDINGS: The cardiomediastinal contours are normal. There is mild peribronchial thickening. Pulmonary vasculature is normal. No consolidation, pleural effusion, or pneumothorax. No acute osseous abnormalities are seen. IMPRESSION: Mild peribronchial thickening, can be seen with asthma or bronchitis. Electronically Signed   By: Ivette Loyal.D.  On: 09/02/2021 23:19   DG Tibia/Fibula Right  Result Date: 09/02/2021 CLINICAL DATA:  Recent I skating injury with lower leg pain, initial encounter EXAM: RIGHT TIBIA AND FIBULA - 2 VIEW COMPARISON:  None Available. FINDINGS: Comminuted fractures of the proximal fibular diaphysis and distal tibial diaphysis are seen. Mild angulation of the fractures is noted posteriorly. No significant soft tissue abnormality is noted. No other focal abnormality is noted. IMPRESSION: Comminuted fractures of the tibia and fibula as described. Electronically Signed   By: Alcide Clever  M.D.   On: 09/02/2021 21:32    Positive ROS: All other systems have been reviewed and were otherwise negative with the exception of those mentioned in the HPI and as above.  Physical Exam: General: Alert, no acute distress Cardiovascular: No pedal edema Respiratory: No cyanosis, no use of accessory musculature GI: No organomegaly, abdomen is soft and non-tender Skin: No lesions in the area of chief complaint Neurologic: Sensation intact distally Psychiatric: Patient is competent for consent with normal mood and affect Lymphatic: No axillary or cervical lymphadenopathy  MUSCULOSKELETAL: Right lower extremity: Tenderness about the tibia, mild swelling, no tenderness about the knee or ankle.  Compartments soft. Good cap refill. Motor and sensory intact distally.  Assessment: 36 year old female admitted with a displaced right tibia/fibular shaft fracture  Plan: I had a long discussion with the patient regarding the nature of her fracture as well as treatment options both operative and nonoperative.  Risk and benefits were discussed in detail.  After discussion, she elected to proceed with right tibia IM nail.  Please keep n.p.o. for surgery later this evening.  Discussed she would likely be in the hospital for a few days for pain control and PT clearance prior to discharge home.    Ross Marcus, MD    09/03/2021 6:24 PM

## 2021-09-03 NOTE — Progress Notes (Signed)
Initial Nutrition Assessment  DOCUMENTATION CODES:   Obesity unspecified  INTERVENTION:   -Once diet is advanced, add:   -Ensure Enlive po BID, each supplement provides 350 kcal and 20 grams of protein -MVI with minerals daily  NUTRITION DIAGNOSIS:   Increased nutrient needs related to post-op healing as evidenced by estimated needs.  GOAL:   Patient will meet greater than or equal to 90% of their needs  MONITOR:   PO intake, Supplement acceptance, Diet advancement  REASON FOR ASSESSMENT:   Consult Assessment of nutrition requirement/status, Hip fracture protocol  ASSESSMENT:   Pt with PMH of insomia and bipolar 1 disorder admitted with displaced right distal third tibia shaft fracture after a fall ice skating  Pt admitted with displaced rt distal third tibia shaft fracture.   Per orthopedics notes, plan for rt tibia IMN. Pt is currently NPO for procedure planned today.    Reviewed wt hx; wt has been stable over the past year.   Pt with increased nutritional needs to support post-op healing and would benefit from addition of oral nutrition supplements.   Medications reviewed and include senokot and 0.9% sodium chloride infusion @ 100 ml/hr.   Labs reviewed.    Diet Order:   Diet Order             Diet NPO time specified  Diet effective midnight                   EDUCATION NEEDS:   No education needs have been identified at this time  Skin:  Skin Assessment: Reviewed RN Assessment  Last BM:  Unknown  Height:   Ht Readings from Last 1 Encounters:  09/02/21 5\' 3"  (1.6 m)    Weight:   Wt Readings from Last 1 Encounters:  09/02/21 93 kg    Ideal Body Weight:  52.3 kg  BMI:  Body mass index is 36.31 kg/m.  Estimated Nutritional Needs:   Kcal:  1650-1850  Protein:  80-95 grams  Fluid:  > 1.6 L    09/04/21, RD, LDN, CDCES Registered Dietitian II Certified Diabetes Care and Education Specialist Please refer to [facility] for RD  and/or RD on-call/weekend/after hours pager

## 2021-09-04 ENCOUNTER — Other Ambulatory Visit: Payer: Self-pay

## 2021-09-04 LAB — CBC
HCT: 36.3 % (ref 36.0–46.0)
Hemoglobin: 12.3 g/dL (ref 12.0–15.0)
MCH: 30.9 pg (ref 26.0–34.0)
MCHC: 33.9 g/dL (ref 30.0–36.0)
MCV: 91.2 fL (ref 80.0–100.0)
Platelets: 241 10*3/uL (ref 150–400)
RBC: 3.98 MIL/uL (ref 3.87–5.11)
RDW: 12.3 % (ref 11.5–15.5)
WBC: 9.1 10*3/uL (ref 4.0–10.5)
nRBC: 0 % (ref 0.0–0.2)

## 2021-09-04 LAB — BASIC METABOLIC PANEL
Anion gap: 6 (ref 5–15)
BUN: 10 mg/dL (ref 6–20)
CO2: 24 mmol/L (ref 22–32)
Calcium: 8.6 mg/dL — ABNORMAL LOW (ref 8.9–10.3)
Chloride: 108 mmol/L (ref 98–111)
Creatinine, Ser: 0.71 mg/dL (ref 0.44–1.00)
GFR, Estimated: >60 mL/min (ref >60)
Glucose, Bld: 154 mg/dL — ABNORMAL HIGH (ref 70–99)
Potassium: 4 mmol/L (ref 3.5–5.1)
Sodium: 138 mmol/L (ref 135–145)

## 2021-09-04 MED ORDER — DOCUSATE SODIUM 100 MG PO CAPS: 100.0000 mg | ORAL_CAPSULE | Freq: Every day | ORAL | 2 refills | Status: AC | PRN

## 2021-09-04 MED ORDER — FENTANYL CITRATE (PF) 100 MCG/2ML IJ SOLN
INTRAMUSCULAR | Status: AC
  Administered 2021-09-04: 50 ug via INTRAVENOUS
  Filled 2021-09-04: qty 2

## 2021-09-04 MED ORDER — 0.9 % SODIUM CHLORIDE (POUR BTL) OPTIME
TOPICAL | Status: DC | PRN
  Administered 2021-09-04: 1000 mL

## 2021-09-04 MED ORDER — OXYCODONE HCL 5 MG PO TABS
ORAL_TABLET | ORAL | Status: AC
  Administered 2021-09-04: 5 mg via ORAL
  Filled 2021-09-04: qty 1

## 2021-09-04 MED ORDER — DROPERIDOL 2.5 MG/ML IJ SOLN: 0.6250 mg | Freq: Once | INTRAMUSCULAR | Status: DC | PRN

## 2021-09-04 MED ORDER — OXYCODONE HCL 5 MG PO TABS: 5.0000 mg | ORAL_TABLET | Freq: Once | ORAL | Status: AC | PRN

## 2021-09-04 MED ORDER — PROMETHAZINE HCL 25 MG/ML IJ SOLN: 6.2500 mg | INTRAMUSCULAR | Status: DC | PRN

## 2021-09-04 MED ORDER — FENTANYL CITRATE (PF) 100 MCG/2ML IJ SOLN
25.0000 ug | INTRAMUSCULAR | Status: DC | PRN
  Administered 2021-09-04 (×2): 50 ug via INTRAVENOUS

## 2021-09-04 MED ORDER — ASPIRIN 325 MG PO TBEC: 325.0000 mg | DELAYED_RELEASE_TABLET | Freq: Two times a day (BID) | ORAL | 0 refills | Status: AC

## 2021-09-04 MED ORDER — HYDROCODONE-ACETAMINOPHEN 5-325 MG PO TABS: 1.0000 | ORAL_TABLET | ORAL | 0 refills | Status: DC | PRN

## 2021-09-04 MED ORDER — ACETAMINOPHEN 10 MG/ML IV SOLN: 1000.0000 mg | Freq: Once | INTRAVENOUS | Status: DC | PRN

## 2021-09-04 MED ORDER — OXYCODONE HCL 5 MG/5ML PO SOLN: 5.0000 mg | Freq: Once | ORAL | Status: AC | PRN

## 2021-09-04 NOTE — Op Note (Signed)
DATE OF SURGERY:  09/04/2021  TIME: 12:06 AM  PATIENT NAME:  Emma Phillips  AGE: 36 y.o.  PRE-OPERATIVE DIAGNOSIS:  Right tibia fracture  POST-OPERATIVE DIAGNOSIS:  SAME  PROCEDURE:  Right INTRAMEDULLARY (IM) NAIL TIBIAL  SURGEON:  Ross Marcus  EBL:  100 cc  COMPLICATIONS:  none  OPERATIVE IMPLANTS: [employer] Intertan femoral nail 8.5 mm x 310 mm  PREOPERATIVE INDICATIONS:  Emma Phillips is a 36 y.o. year old who fell and suffered a displaced right tibial/fibular shaft fracture. She was brought into the ER and then admitted and optimized and then elected for surgical intervention.    The risks benefits and alternatives were discussed with the patient including but not limited to the risks of nonoperative treatment, versus surgical intervention including infection, bleeding, nerve injury, malunion, nonunion, hardware prominence, hardware failure, need for hardware removal, blood clots, cardiopulmonary complications, morbidity, mortality, among others, and they were willing to proceed.    OPERATIVE PROCEDURE:  The patient was brought to the operating room and placed in the supine position.  General anesthesia was administered.  Tourniquet was placed on the thigh.  Time out was then performed after sterile prep and drape. She received preoperative antibiotics.   Point-to-point bone clamp was utilized for percutaneous reduction with the use of C-arm fluoroscopy.  This was checked on AP and lateral imaging.   3 cm incision was then made at the superior aspect of the patella.  Dissection was carried down through the quadriceps tendon.  Guidepin was then placed in appropriate position on AP and lateral imaging adjacent to the lateral tibial eminence.  Opening reamer was utilized.  Ball-tipped guidewire was placed to the distal tibial physeal scar.  Tibia was reamed to 10 mm with appropriate chatter.  The above size nail was opened and placed with gentle mallet impaction.    Using the jig, 2 proximal screws were placed.  Perfect circles were utilized for placement of 2 distal screws from medial to lateral.   Final x-rays showed appropriate fracture reduction, hardware placement, screw length and trajectory.   The wounds were irrigated copiously and closed with Vicryl  followed by staples and dry sterile dressing.  Well-padded short leg splint was placed.  Sponge and needle count were correct.   The patient was awakened and returned to PACU in stable and satisfactory condition. There no complications and the patient tolerated the procedure well.     POSTOPERATIVE PLAN: She will be PWB (25%) Ok to start DVT ppx POD#1 - 325mg  aspirin twice daily Dressing change by nursing staff as needed to keep dressing clean and dry Outpatient f/u in clinic in 2 weeks for staple removal and xrays   

## 2021-09-04 NOTE — Progress Notes (Signed)
1633 WC and 3:1 delivered to pt. 10mg  of PO norco given to pt at this time as well. Pt aware and verbalized understanding next pain pills would not be due until 1033PM if needed for pain

## 2021-09-04 NOTE — Progress Notes (Signed)
Physical Therapy Treatment Patient Details Name: Emma Phillips MRN: [id number] DOB: 1985-06-20 Today's Date: 09/04/2021   History of Present Illness Emma Phillips is a 36 y.o. year old who fell and suffered a displaced right tibial/fibular shaft fracture. She was brought into the ER and then admitted and optimized and then elected for surgical intervention. Pt S/P ORIF of L tibia.    PT Comments    Pt is making good progress towards goals with ability to navigate stair training with safe technique. +2 used for safety and educated on having additional person at home to provide assistance. Demonstration performed prior to performance. Educated on [place] restrictions. HEP reviewed. Pt eager to dc home. All PT needs met at this time, RN notified.   Recommendations for follow up therapy are one component of a multi-disciplinary discharge planning process, led by the attending physician.  Recommendations may be updated based on patient status, additional functional criteria and insurance authorization.  Follow Up Recommendations  Home health PT     Assistance Recommended at Discharge Intermittent Supervision/Assistance  Patient can return home with the following A little help with walking and/or transfers;A little help with bathing/dressing/bathroom;Assistance with cooking/housework;Assist for transportation;Help with stairs or ramp for entrance   Equipment Recommendations  Rolling walker (2 wheels);BSC/3in1    Recommendations for Other Services       Precautions / Restrictions Precautions Precautions: Fall Restrictions Weight Bearing Restrictions: Yes RLE Weight Bearing: Partial weight bearing RLE Partial Weight Bearing Percentage or Pounds: 25     Mobility  Bed Mobility               General bed mobility comments: NT, received in recliner    Transfers Overall transfer level: Needs assistance Equipment used: Rolling walker (2 wheels) Transfers: Sit to/from Stand,  Bed to chair/wheelchair/BSC Sit to Stand: Min guard           General transfer comment: cues for hand placement. RW used. Once standing upright posture. Able to maintain PWB    Ambulation/Gait Ambulation/Gait assistance: Min guard Gait Distance (Feet): 5 Feet Assistive device: Rolling walker (2 wheels) Gait Pattern/deviations: Step-to pattern, Antalgic, Decreased step length - left       General Gait Details: ambulated short distance to stairs using RW   Stairs Stairs: Yes Stairs assistance: Min assist Stair Management: No rails, Backwards, With walker Number of Stairs: 2 General stair comments: up/down with RW with demonstration performed prior. step to gait pattern noted.   Wheelchair Mobility    Modified Rankin (Stroke Patients Only)       Balance Overall balance assessment: Needs assistance Sitting-balance support: Bilateral upper extremity supported Sitting balance-Leahy Scale: Good     Standing balance support: Bilateral upper extremity supported Standing balance-Leahy Scale: Fair                              Cognition Arousal/Alertness: Awake/alert Behavior During Therapy: WFL for tasks assessed/performed Overall Cognitive Status: Within Functional Limits for tasks assessed                                          Exercises      General Comments        Pertinent Vitals/Pain Pain Assessment Pain Assessment: Faces Faces Pain Scale: Hurts little more Pain Location: R lower leg Pain Descriptors / Indicators: Throbbing Pain Intervention(s): Limited  activity within patient's tolerance, Repositioned, Utilized relaxation techniques, Ice applied    Home Living Family/patient expects to be discharged to:: Private residence Living Arrangements: Spouse/significant other;Children (multiple pets) Available Help at Discharge: Family;Available 24 hours/day (spouse works.) Type of Home: House Home Access: Stairs to  enter Entrance Stairs-Rails: None Technical brewer of Steps: 2-5   Home Layout: One level Home Equipment: None      Prior Function            PT Goals (current goals can now be found in the care plan section) Acute Rehab PT Goals Patient Stated Goal: " I want to get better and be like before." PT Goal Formulation: With patient Time For Goal Achievement: 09/18/21 Potential to Achieve Goals: Good Progress towards PT goals: Progressing toward goals    Frequency    BID      PT Plan Current plan remains appropriate    Co-evaluation              AM-PAC PT "6 Clicks" Mobility   Outcome Measure  Help needed turning from your back to your side while in a flat bed without using bedrails?: A Little Help needed moving from lying on your back to sitting on the side of a flat bed without using bedrails?: A Little Help needed moving to and from a bed to a chair (including a wheelchair)?: A Little Help needed standing up from a chair using your arms (e.g., wheelchair or bedside chair)?: A Little Help needed to walk in hospital room?: A Little Help needed climbing 3-5 steps with a railing? : A Lot 6 Click Score: 17    End of Session Equipment Utilized During Treatment: Gait belt Activity Tolerance: Patient limited by pain Patient left: in chair;with call bell/phone within reach;with chair alarm set Nurse Communication: Mobility status;Patient requests pain meds;Weight bearing status;Precautions PT Visit Diagnosis: Unsteadiness on feet (R26.81);Other abnormalities of gait and mobility (R26.89);Muscle weakness (generalized) (M62.81);Pain;Difficulty in walking, not elsewhere classified (R26.2) Pain - Right/Left: Right Pain - part of body: Leg     Time: 1438-8875 PT Time Calculation (min) (ACUTE ONLY): 27 min  Charges:  $Gait Training: 23-37 mins $Therapeutic Exercise: 8-22 mins $Therapeutic Activity: 8-22 mins                     Greggory Stallion, PT, DPT,  [facility] [phone]    [name] 09/04/2021, 1:38 PM

## 2021-09-04 NOTE — TOC Progression Note (Signed)
Transition of Care [facility]) - Progression Note    Patient Details  Name: Emma Phillips MRN: [id number] Date of Birth: [birth year]  Transition of Care [facility]) CM/SW Contact  Bing Quarry, RN Phone Number: 09/04/2021, 1:32 PM  Clinical Narrative:  8/19: Patient being discharged today. Spoke with patient/spouse regarding HH/Outpatient choices. DME ordered WC, requested 3:1 and RW from provider via Secure chat and Unit RN. No agency will accept but recommended [facility] for insurance. Explained again to spouse and to contact PCP if further needs post discharge from acute care. DME WC ordered from [employer] to be delivered prior to discharge/progress note narrative entered for provider to co-sign.  Spouse is able to transport patient to outpatient therapy. Gabriel Cirri RN CM          Expected Discharge Plan and Services           Expected Discharge Date: 09/04/21                                     Social Determinants of Health (SDOH) Interventions    Readmission Risk Interventions     No data to display

## 2021-09-04 NOTE — Progress Notes (Signed)
    Durable Medical Equipment  (From admission, onward)           Start     Ordered   09/04/21 1155  For home use only DME lightweight manual wheelchair with seat cushion  Once       Comments: Patient suffers from tibia fracture which impairs their ability to perform daily activities like bathing, dressing, grooming, and toileting in the home.  A walker will not resolve  issue with performing activities of daily living. A wheelchair will allow patient to safely perform daily activities. Patient is not able to propel themselves in the home using a standard weight wheelchair due to arm weakness, endurance, and general weakness. Patient can self propel in the lightweight wheelchair. Length of need 6 months . Accessories: elevating leg rests (ELRs), wheel locks, extensions and anti-tippers.   09/04/21 1155

## 2021-09-04 NOTE — Progress Notes (Cosign Needed)
Occupational Therapy * Physical Therapy * Speech Therapy          DATE ___________________ PATIENT NAME_____________________ PATIENT MRN____________________  DIAGNOSIS/DIAGNOSIS CODE ______________________ DATE OF DISCHARGE: ______________  PRIMARY CARE PHYSICIAN __________________________ PCP PHONE/FAX___________________________     Dear Provider (Name: __________________   Fax: ___________________________):   I certify that I have examined this patient and that occupational/physical/speech therapy is necessary on an outpatient basis.    The patient has expressed interest in completing their recommended course of therapy at your location.  Once a formal order from the patient's primary care physician has been obtained, please contact him/her to schedule an appointment for evaluation at your earliest convenience.   [  ]  Physical Therapy Evaluate and Treat          [  ]  Occupational Therapy Evaluate and Treat                                    [  ]  Speech Therapy Evaluate and Treat       The patient's primary care physician (listed above) must furnish and be responsible for a formal order such that the recommended services may be furnished while under the primary physician's care, and that the plan of care will be established and reviewed every 30 days (or more often if condition necessitates).  

## 2021-09-04 NOTE — Transfer of Care (Signed)
Immediate Anesthesia Transfer of Care Note  Patient: Emma Phillips  Procedure(s) Performed: INTRAMEDULLARY (IM) NAIL TIBIAL (Right)  Patient Location: PACU  Anesthesia Type:General  Level of Consciousness: sedated  Airway & Oxygen Therapy: Patient Spontanous Breathing and Patient connected to face mask oxygen  Post-op Assessment: Report given to RN and Post -op Vital signs reviewed and stable  Post vital signs: Reviewed  Last Vitals:  Vitals Value Taken Time  BP    Temp    Pulse 71 09/04/21 0009  Resp    SpO2 97 % 09/04/21 0009  Vitals shown include unvalidated device data.  Last Pain:  Vitals:   09/03/21 2018  TempSrc: Oral  PainSc:          Complications: No notable events documented.

## 2021-09-04 NOTE — Anesthesia Postprocedure Evaluation (Signed)
Anesthesia Post Note  Patient: Emma Phillips  Procedure(s) Performed: INTRAMEDULLARY (IM) NAIL TIBIAL (Right)  Patient location during evaluation: PACU Anesthesia Type: General Level of consciousness: awake and alert Pain management: pain level controlled Vital Signs Assessment: post-procedure vital signs reviewed and stable Respiratory status: spontaneous breathing, nonlabored ventilation and respiratory function stable Cardiovascular status: blood pressure returned to baseline and stable Postop Assessment: no apparent nausea or vomiting Anesthetic complications: no   No notable events documented.   Last Vitals:  Vitals:   09/04/21 0749 09/04/21 0750  BP: 134/84 134/84  Pulse: 76 76  Resp: 17   Temp: 36.9 C 36.9 C  SpO2: 97% 97%    Last Pain:  Vitals:   09/04/21 0808  TempSrc:   PainSc: 8                  Alexsandro Salek Romie Minus

## 2021-09-04 NOTE — Evaluation (Signed)
Physical Therapy Evaluation Patient Details Name: Emma Phillips MRN: [id number] DOB: [birth year] Today's Date: 09/04/2021  History of Present Illness  Emma Phillips is a 36 y.o. year old who fell and suffered a displaced right tibial/fibular shaft fracture. She was brought into the ER and then admitted and optimized and then elected for surgical intervention. Pt S/P ORIF of L tibia.  Clinical Impression  Pt received in bed agreeable to participate in PT evaluation. Pt is independent at baseline with household level and Community level activity participation. Today's assessment revealed pt has throbbing pain in R lower leg at rest and increases with movement. Pt has edema in lower leg and R knee. Pt needed max assist with RLE to preform Supine to sit. Sit to stand  and to chair and ambulated in room by the bedside with Min assist with mod VC of push down thru arms and to place front part of the R foot to comply with PWB. Pt follows commands well. Pt pain began to subside after 10 mins rest.  Pt performed isometric to RLE and advised to perform them every 2 hours. Pt also advised to notify id decrease in sensation, cold foot or decreased toe movement is noted in RLE. Pt has minimum of 2 step to enter form back w/o hand rail which needs practice. Pt will benefit form HHPT, FWW, 3 in 1 BSC, and shower sit to return home safely to return to PLOF. Pt      Recommendations for follow up therapy are one component of a multi-disciplinary discharge planning process, led by the attending physician.  Recommendations may be updated based on patient status, additional functional criteria and insurance authorization.  Follow Up Recommendations Home health PT      Assistance Recommended at Discharge Intermittent Supervision/Assistance  Patient can return home with the following  A little help with walking and/or transfers;A little help with bathing/dressing/bathroom;Assistance with cooking/housework;Assist  for transportation;Help with stairs or ramp for entrance    Equipment Recommendations Rolling walker (2 wheels);BSC/3in1 (shower seat)  Recommendations for Other Services       Functional Status Assessment Patient has had a recent decline in their functional status and demonstrates the ability to make significant improvements in function in a reasonable and predictable amount of time.     Precautions / Restrictions Precautions Precautions: Fall Restrictions Weight Bearing Restrictions: Yes RLE Weight Bearing: Partial weight bearing RLE Partial Weight Bearing Percentage or Pounds: 25      Mobility  Bed Mobility Overal bed mobility: Needs Assistance Bed Mobility: Supine to Sit     Supine to sit: Max assist (to RLE)          Transfers Overall transfer level: Needs assistance Equipment used: Rolling walker (2 wheels) Transfers: Sit to/from Stand, Bed to chair/wheelchair/BSC Sit to Stand: Min assist   Step pivot transfers: Min assist       General transfer comment: PWB complied    Ambulation/Gait Ambulation/Gait assistance: Min assist Gait Distance (Feet): 7 Feet Assistive device: Rolling walker (2 wheels) Gait Pattern/deviations: Step-to pattern, Antalgic, Decreased step length - left Gait velocity: decreased        Stairs            Wheelchair Mobility    Modified Rankin (Stroke Patients Only)       Balance Overall balance assessment: Needs assistance Sitting-balance support: Bilateral upper extremity supported Sitting balance-Leahy Scale: Good     Standing balance support: Bilateral upper extremity supported Standing balance-Leahy Scale: Fair  Single Leg Stance - Left Leg: 30                         Pertinent Vitals/Pain Pain Assessment Pain Assessment: 0-10 Pain Score: 7  Pain Location: R lower leg Pain Descriptors / Indicators: Throbbing Pain Intervention(s): Patient requesting pain meds-RN notified, Ice applied     Home Living Family/patient expects to be discharged to:: Private residence Living Arrangements: Spouse/significant other;Children (multiple pets) Available Help at Discharge: Family;Available 24 hours/day (spouse works.) Type of Home: House Home Access: Stairs to enter Entrance Stairs-Rails: None Secretary/administrator of Steps: 2-5   Home Layout: One level Home Equipment: None      Prior Function Prior Level of Function : Independent/Modified Independent;Driving             Mobility Comments: Prior to this hospitalization, 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 ADLs Comments: Independent     Hand Dominance   Dominant Hand: Right    Extremity/Trunk Assessment   Upper Extremity Assessment Upper Extremity Assessment: Overall WFL for tasks assessed    Lower Extremity Assessment Lower Extremity Assessment: RLE deficits/detail RLE Deficits / Details: R hip 3 w/o pain and  Knee 3- with pain RLE: Unable to fully assess due to pain RLE Sensation: WNL RLE Coordination: WNL       Communication   Communication: No difficulties  Cognition Arousal/Alertness: Awake/alert Behavior During Therapy: WFL for tasks assessed/performed Overall Cognitive Status: Within Functional Limits for tasks assessed                                          General Comments      Exercises General Exercises - Lower Extremity Ankle Circles/Pumps: AROM, 10 reps, Seated (L only and wiggling R toes onlu) Quad Sets: Strengthening, 10 reps, Seated Gluteal Sets: AROM, 10 reps, Seated   Assessment/Plan    PT Assessment Patient needs continued PT services  PT Problem List Decreased strength;Decreased range of motion;Decreased activity tolerance;Decreased balance;Decreased mobility;Decreased safety awareness;Obesity;Pain       PT Treatment Interventions Gait training;Stair  training;Functional mobility training;Therapeutic activities;Therapeutic exercise;Balance training;Neuromuscular re-education;Patient/family education    PT Goals (Current goals can be found in the Care Plan section)  Acute Rehab PT Goals Patient Stated Goal: " I want to get better and be like before." PT Goal Formulation: With patient Time For Goal Achievement: 09/18/21 Potential to Achieve Goals: Good    Frequency BID     Co-evaluation               AM-PAC PT "6 Clicks" Mobility  Outcome Measure Help needed turning from your back to your side while in a flat bed without using bedrails?: A Little Help needed moving from lying on your back to sitting on the side of a flat bed without using bedrails?: A Lot Help needed moving to and from a bed to a chair (including a wheelchair)?: A Little Help needed standing up from a chair using your arms (e.g., wheelchair or bedside chair)?: A Little Help needed to walk in hospital room?: A Little Help needed climbing 3-5 steps with a railing? : A Lot 6 Click Score: 16    End of Session Equipment Utilized During Treatment: Gait belt Activity Tolerance: Patient tolerated treatment well;Patient limited by pain Patient  left: in chair;with call bell/phone within reach;with chair alarm set Nurse Communication: Mobility status;Patient requests pain meds;Weight bearing status;Precautions PT Visit Diagnosis: Unsteadiness on feet (R26.81);Other abnormalities of gait and mobility (R26.89);Muscle weakness (generalized) (M62.81);Pain;Difficulty in walking, not elsewhere classified (R26.2) Pain - Right/Left: Right Pain - part of body: Leg    Time: 7017-7939 PT Time Calculation (min) (ACUTE ONLY): 50 min   Charges:   PT Evaluation $PT Eval Low Complexity: 1 Low PT Treatments $Gait Training: 8-22 mins $Therapeutic Exercise: 8-22 mins $Therapeutic Activity: 8-22 mins        Alixander Rallis PT DPT 10:55 AM,09/04/21

## 2021-09-04 NOTE — Plan of Care (Signed)
  Problem: Education: Goal: Knowledge of General Education information will improve Description: Including pain rating scale, medication(s)/side effects and non-pharmacologic comfort measures 09/04/2021 0612 by Verlin Fester, LPN Outcome: Progressing 09/04/2021 0612 by Verlin Fester, LPN Outcome: Progressing   Problem: Health Behavior/Discharge Planning: Goal: Ability to manage health-related needs will improve 09/04/2021 0612 by Verlin Fester, LPN Outcome: Progressing 09/04/2021 0612 by Verlin Fester, LPN Outcome: Progressing   Problem: Clinical Measurements: Goal: Ability to maintain clinical measurements within normal limits will improve 09/04/2021 0612 by Verlin Fester, LPN Outcome: Progressing 09/04/2021 0612 by Verlin Fester, LPN Outcome: Progressing Goal: Will remain free from infection 09/04/2021 0612 by Verlin Fester, LPN Outcome: Progressing 09/04/2021 0612 by Verlin Fester, LPN Outcome: Progressing Goal: Diagnostic test results will improve 09/04/2021 0612 by Verlin Fester, LPN Outcome: Progressing 09/04/2021 0612 by Verlin Fester, LPN Outcome: Progressing Goal: Respiratory complications will improve Outcome: Progressing Goal: Cardiovascular complication will be avoided Outcome: Progressing   Problem: Activity: Goal: Risk for activity intolerance will decrease 09/04/2021 0612 by Verlin Fester, LPN Outcome: Progressing 09/04/2021 0612 by Verlin Fester, LPN Outcome: Progressing   Problem: Nutrition: Goal: Adequate nutrition will be maintained 09/04/2021 0612 by Verlin Fester, LPN Outcome: Progressing 09/04/2021 0612 by Verlin Fester, LPN Outcome: Progressing   Problem: Coping: Goal: Level of anxiety will decrease Outcome: Progressing   Problem: Elimination: Goal: Will not experience complications related to bowel motility 09/04/2021 0612 by Verlin Fester, LPN Outcome: Progressing 09/04/2021 0612 by Verlin Fester, LPN Outcome:  Progressing Goal: Will not experience complications related to urinary retention Outcome: Progressing   Problem: Pain Managment: Goal: General experience of comfort will improve 09/04/2021 0612 by Verlin Fester, LPN Outcome: Progressing 09/04/2021 0612 by Verlin Fester, LPN Outcome: Progressing   Problem: Safety: Goal: Ability to remain free from injury will improve 09/04/2021 0612 by Verlin Fester, LPN Outcome: Progressing 09/04/2021 0612 by Verlin Fester, LPN Outcome: Progressing   Problem: Skin Integrity: Goal: Risk for impaired skin integrity will decrease 09/04/2021 0612 by Verlin Fester, LPN Outcome: Progressing 09/04/2021 0612 by Verlin Fester, LPN Outcome: Progressing

## 2021-09-04 NOTE — Discharge Summary (Signed)
Physician Discharge Summary  Patient ID: Emma Phillips MRN: [id number] DOB/AGE: 1985/11/26 36 y.o.  Admit date: 09/02/2021 Discharge date: 09/04/2021  Admission Diagnoses:  Right tibia fracture Displaced spiral fracture of shaft of right tibia, initial encounter for closed fracture  Discharge Diagnoses:  Right tibia fracture Principal Problem:   Displaced spiral fracture of shaft of right tibia, initial encounter for closed fracture   Past Medical History:  Diagnosis Date   Bipolar 1 disorder (HCC)    Insomnia     Surgeries: Procedure(s): INTRAMEDULLARY (IM) NAIL TIBIAL on 09/03/2021   Consultants (if any):   Discharged Condition: Improved  Hospital Course: Emma Phillips is an 36 y.o. female who was admitted 09/02/2021 with a diagnosis of  Right tibia fracture Displaced spiral fracture of shaft of right tibia, initial encounter for closed fracture and went to the operating room on 09/03/2021 and underwent the above named procedures.    She was given perioperative antibiotics:  Anti-infectives (From admission, onward)    Start     Dose/Rate Route Frequency Ordered Stop   09/03/21 2221  vancomycin (VANCOCIN) 1-5 GM/200ML-% IVPB       Note to Pharmacy: Knute Neu L: cabinet override      09/03/21 2221 09/04/21 1029   09/03/21 2115  vancomycin (VANCOREADY) IVPB 1500 mg/300 mL  Status:  Discontinued        1,500 mg 150 mL/hr over 120 Minutes Intravenous To Surgery 09/03/21 2018 09/03/21 2026   09/03/21 2115  vancomycin (VANCOCIN) IVPB 1000 mg/200 mL premix  Status:  Discontinued        1,000 mg 200 mL/hr over 60 Minutes Intravenous Every 12 hours 09/03/21 2016 09/03/21 2026   09/03/21 2100  vancomycin (VANCOCIN) IVPB 1000 mg/200 mL premix        1,000 mg 200 mL/hr over 60 Minutes Intravenous Every 12 hours 09/03/21 2026 09/04/21 1029     .  She was given sequential compression devices, early ambulation, and aspirin for DVT prophylaxis.  She benefited maximally  from the hospital stay and there were no complications.    Recent vital signs:  Vitals:   09/04/21 0749 09/04/21 0750  BP: 134/84 134/84  Pulse: 76 76  Resp: 17   Temp: 98.4 F (36.9 C) 98.4 F (36.9 C)  SpO2: 97% 97%    Recent laboratory studies:  Lab Results  Component Value Date   HGB 12.3 09/04/2021   HGB 11.8 (L) 09/02/2021   HGB 13.7 08/10/2020   Lab Results  Component Value Date   WBC 9.1 09/04/2021   PLT 241 09/04/2021   No results found for: "INR" Lab Results  Component Value Date   NA 138 09/04/2021   K 4.0 09/04/2021   CL 108 09/04/2021   CO2 24 09/04/2021   BUN 10 09/04/2021   CREATININE 0.71 09/04/2021   GLUCOSE 154 (H) 09/04/2021    Discharge Medications:   Allergies as of 09/04/2021       Reactions   Penicillins Anaphylaxis        Medication List     TAKE these medications    aspirin EC 325 MG tablet Take 1 tablet (325 mg total) by mouth 2 (two) times daily.   b complex vitamins capsule Take 1 capsule by mouth daily.   buPROPion 300 MG 24 hr tablet Commonly known as: WELLBUTRIN XL Take 300 mg by mouth daily. psych   cyanocobalamin 500 MCG tablet Commonly known as: VITAMIN B12 Take 500 mcg by mouth daily.   docusate  sodium 100 MG capsule Commonly known as: Colace Take 1 capsule (100 mg total) by mouth daily as needed.   HYDROcodone-acetaminophen 5-325 MG tablet Commonly known as: NORCO/VICODIN Take 1-2 tablets by mouth every 4 (four) hours as needed for moderate pain (pain score 4-6).   HYDROcodone-acetaminophen 5-325 MG tablet Commonly known as: NORCO/VICODIN Take 1-2 tablets by mouth every 4 (four) hours as needed for moderate pain.   lamoTRIgine 150 MG tablet Commonly known as: LAMICTAL Take 150 mg by mouth 2 (two) times daily. psych   levothyroxine 50 MCG tablet Commonly known as: SYNTHROID Take 1 tablet (50 mcg total) by mouth daily.   prazosin 2 MG capsule Commonly known as: MINIPRESS Take 2 mg by mouth at  bedtime. Dr Rexene Edison        Diagnostic Studies: DG C-Arm 1-60 Min-No Report  Result Date: 09/04/2021 CLINICAL DATA:  Right tibial nail placement EXAM: RIGHT TIBIA AND FIBULA - 2 VIEW; DG C-ARM 1-60 MIN-NO REPORT COMPARISON:  None Available. FINDINGS: Multiple intraoperative fluoroscopic spot images are provided. Comminuted distal tibial diaphyseal fracture transfixed with a intramedullary nail and interlocking screws. Comminuted mid fibular diaphyseal fracture. FLUOROSCOPY TIME:  Radiation Exposure Index: 3.545 mGy IMPRESSION: 1. Interval ORIF of a right distal tibial diaphyseal fracture. 2. Comminuted mid fibular diaphyseal fracture. Electronically Signed   By: Elige Ko M.D.   On: 09/04/2021 07:40   DG Tibia/Fibula Right  Result Date: 09/04/2021 CLINICAL DATA:  Right tibial nail placement EXAM: RIGHT TIBIA AND FIBULA - 2 VIEW; DG C-ARM 1-60 MIN-NO REPORT COMPARISON:  None Available. FINDINGS: Multiple intraoperative fluoroscopic spot images are provided. Comminuted distal tibial diaphyseal fracture transfixed with a intramedullary nail and interlocking screws. Comminuted mid fibular diaphyseal fracture. FLUOROSCOPY TIME:  Radiation Exposure Index: 3.545 mGy IMPRESSION: 1. Interval ORIF of a right distal tibial diaphyseal fracture. 2. Comminuted mid fibular diaphyseal fracture. Electronically Signed   By: Elige Ko M.D.   On: 09/04/2021 07:40   Chest Portable 1 View  Result Date: 09/02/2021 CLINICAL DATA:  Preop tib fib fracture. EXAM: PORTABLE CHEST 1 VIEW COMPARISON:  None Available. FINDINGS: The cardiomediastinal contours are normal. There is mild peribronchial thickening. Pulmonary vasculature is normal. No consolidation, pleural effusion, or pneumothorax. No acute osseous abnormalities are seen. IMPRESSION: Mild peribronchial thickening, can be seen with asthma or bronchitis. Electronically Signed   By: Narda Rutherford M.D.   On: 09/02/2021 23:19   DG Tibia/Fibula Right  Result Date:  09/02/2021 CLINICAL DATA:  Recent I skating injury with lower leg pain, initial encounter EXAM: RIGHT TIBIA AND FIBULA - 2 VIEW COMPARISON:  None Available. FINDINGS: Comminuted fractures of the proximal fibular diaphysis and distal tibial diaphysis are seen. Mild angulation of the fractures is noted posteriorly. No significant soft tissue abnormality is noted. No other focal abnormality is noted. IMPRESSION: Comminuted fractures of the tibia and fibula as described. Electronically Signed   By: Alcide Clever M.D.   On: 09/02/2021 21:32    Disposition: Discharge disposition: 01-Home or Self Care            Signed: Lyndle Herrlich ,MD 09/04/2021, 11:54 AM

## 2021-09-04 NOTE — Discharge Instructions (Signed)
Follow-up with Dr. Okey Dupre in 12 to 14 days  Call with questions or concerns including increasing pain, shortness of breath, fever, or wound drainage at [phone]  Elevate the leg  Partial weight bearing 25%

## 2021-09-04 NOTE — Progress Notes (Signed)
Patient is not able to walk the distance required to go the bathroom, or he/she is unable to safely negotiate stairs required to access the bathroom.  A 3in1 BSC will alleviate this problem Barbie Krista Godsil RN CM  

## 2021-09-06 ENCOUNTER — Encounter: Payer: Self-pay | Admitting: Orthopaedic Surgery

## 2021-09-06 LAB — POCT PREGNANCY, URINE: Preg Test, Ur: NEGATIVE

## 2021-09-09 ENCOUNTER — Ambulatory Visit: Payer: Self-pay | Admitting: *Deleted

## 2021-09-09 ENCOUNTER — Other Ambulatory Visit: Payer: Self-pay | Admitting: *Deleted

## 2021-09-09 NOTE — Telephone Encounter (Signed)
Requested medication (s) are due for refill today: na  Requested medication (s) are on the active medication list: yes  Last refill:  09/03/21 #30 0 refills  Future visit scheduled: yes in 5 days  Notes to clinic:  not delegated per protocol. Patient has 2 tablets left and requesting refill due to pain in right leg. Last ordered in ED. Please advise . Not earlier appt until 09/14/21     Requested Prescriptions  Pending Prescriptions Disp Refills   HYDROcodone-acetaminophen (NORCO/VICODIN) 5-325 MG tablet 30 tablet 0    Sig: Take 1-2 tablets by mouth every 4 (four) hours as needed for moderate pain.     Not Delegated - Analgesics:  Opioid Agonist Combinations Failed - 09/09/2021  4:27 PM      Failed - This refill cannot be delegated      Failed - Urine Drug Screen completed in last 360 days      Passed - Valid encounter within last 3 months    Recent Outpatient Visits           3 weeks ago Hypothyroidism, unspecified type   [place] [facility]   6 months ago Hypothyroidism, unspecified type   [facility] at [facility], [facility]   1 year ago Hypothyroidism, unspecified type    [facility] and [facility] Medicine at [facility], MD   2 years ago Hypothyroidism, unspecified type   [facility] at [facility], [facility]   2 years ago Establishing care with new doctor, encounter for   [facility] at [facility], MD       Future Appointments             In 5 days Duanne Limerick, MD [facility] Primary Care and Sports Medicine at [facility], [facility]   In 5 months Duanne Limerick, MD [facility] Primary Care and Sports Medicine at [facility], [facility]

## 2021-09-09 NOTE — Telephone Encounter (Signed)
Patient requesting refill for hydrocodone prescribed at ED 09/02/21 for right tibia break. Requesting PCP refill or renew medication until she can be seen 09/14/21 . Please advise .     Reason for Disposition  Prescription request for new medicine (not a refill)  Answer Assessment - Initial Assessment Questions 1. DRUG NAME: "What medicine do you need to have refilled?"     Hydrocodone  2. REFILLS REMAINING: "How many refills are remaining?" (Note: The label on the medicine or pill bottle will show how many refills are remaining. If there are no refills remaining, then a renewal may be needed.)     None  3. EXPIRATION DATE: "What is the expiration date?" (Note: The label states when the prescription will expire, and thus can no longer be refilled.)     na 4. PRESCRIBING HCP: "Who prescribed it?" Reason: If prescribed by specialist, call should be referred to that group.     Prescribed by ED MD 5. SYMPTOMS: "Do you have any symptoms?"     Pain in leg right tibia  6. PREGNANCY: "Is there any chance that you are pregnant?" "When was your last menstrual period?"     na  Protocols used: Medication Refill and Renewal Call-A-AH

## 2021-09-10 ENCOUNTER — Emergency Department
Admission: EM | Admit: 2021-09-10 | Discharge: 2021-09-10 | Disposition: A | Discharge status: Home / Self Care | Payer: Commercial Managed Care - PPO | Attending: Emergency Medicine | Admitting: Emergency Medicine

## 2021-09-10 ENCOUNTER — Other Ambulatory Visit: Payer: Self-pay

## 2021-09-10 DIAGNOSIS — Z760 Encounter for issue of repeat prescription: Secondary | ICD-10-CM | POA: Diagnosis present

## 2021-09-10 MED ORDER — HYDROCODONE-ACETAMINOPHEN 5-325 MG PO TABS: 1.0000 | ORAL_TABLET | ORAL | 0 refills | Status: DC | PRN

## 2021-09-10 NOTE — ED Notes (Signed)
First Nurse Note: Pt to ED via POV requesting refill on her pain medication

## 2021-09-10 NOTE — Discharge Instructions (Addendum)
I have given you some more no Norco.  I cannot give you any more than what you have gotten from me.  When this is done try using Motrin.  Take 4 of the over-the-counter pills 3 times a day with food.  Do that for about a week and then you can probably switch to Tylenol.  Your leg should be a lot better by that time.  If you need more please follow-up with orthopedics.

## 2021-09-10 NOTE — Telephone Encounter (Signed)
Pt. Calling in regard to refill on pain medication. [facility] agent reports the practice states PCP not working today and pt needs to go to ED. Pt. Verbalizes understanding.

## 2021-09-10 NOTE — ED Provider Notes (Signed)
   [facility] Provider Note    Event Date/Time   First MD Initiated Contact with Patient 09/10/21 1146     (approximate)   History   Medication Refill   HPI  Emma Phillips is a 36 y.o. female who comes in after having a tibia fracture 8 days ago..  She says she had 3 days of Norco given to her made in the last today but now that she is out and she is hurting.  She is following instructions using her walker not putting hardly any weight on the leg as she supposed to.      Physical Exam   Triage Vital Signs: ED Triage Vitals  Enc Vitals Group     BP 09/10/21 1123 (!) 141/97     Pulse Rate 09/10/21 1123 (!) 107     Resp 09/10/21 1123 17     Temp 09/10/21 1145 98.4 F (36.9 C)     Temp Source 09/10/21 1123 Oral     SpO2 09/10/21 1123 92 %     Weight 09/10/21 1158 204 lb 12.9 oz (92.9 kg)     Height 09/10/21 1158 5\' 3"  (1.6 m)     Head Circumference --      Peak Flow --      Pain Score 09/10/21 1142 8     Pain Loc --      Pain Edu? --      Excl. in GC? --     Most recent vital signs: Vitals:   09/10/21 1123 09/10/21 1145  BP: (!) 141/97   Pulse: (!) 107   Resp: 17   Temp:  98.4 F (36.9 C)  SpO2: 92%     General: Awake, no distress.  CV:  Good peripheral perfusion.  Resp:  Normal effort.  Abd:  No distention.  Extremities splint in place toes with good range of motion good capillary refill and sensation move well   ED Results / Procedures / Treatments   Labs (all labs ordered are listed, but only abnormal results are displayed) Labs Reviewed - No data to display   EKG     RADIOLOGY    PROCEDURES:  Critical Care performed:   Procedures   MEDICATIONS ORDERED IN ED: Medications - No data to display   IMPRESSION / MDM / ASSESSMENT AND PLAN / ED COURSE  I reviewed the triage vital signs and the nursing notes. Discussed with patient using Motrin after the 6 Norco I gave her run out and then following up with  orthopedics and her regular doctor.        FINAL CLINICAL IMPRESSION(S) / ED DIAGNOSES   Final diagnoses:  Medication refill     Rx / DC Orders   ED Discharge Orders          Ordered    HYDROcodone-acetaminophen (NORCO) 5-325 MG tablet  Every 4 hours PRN        09/10/21 1154             Note:  This document was prepared using Dragon voice recognition software and may include unintentional dictation errors.   09/12/21, MD 09/10/21 [phone]

## 2021-09-10 NOTE — ED Triage Notes (Signed)
Pt presents to ED with c/o of needing a Norco medication refill. Pt states her PCP cant get her in until next Tuesday at at [facility] primary care.

## 2021-09-13 ENCOUNTER — Telehealth: Payer: Self-pay

## 2021-09-13 NOTE — Telephone Encounter (Signed)
Called pt and left message for her to have a follow up with Dr Ross Marcus (ortho) instead of Dr Yetta Barre.

## 2021-09-14 ENCOUNTER — Inpatient Hospital Stay: Payer: Commercial Managed Care - PPO | Admitting: Family Medicine

## 2021-10-12 ENCOUNTER — Other Ambulatory Visit: Payer: Self-pay | Admitting: Family Medicine

## 2021-10-12 DIAGNOSIS — E039 Hypothyroidism, unspecified: Secondary | ICD-10-CM

## 2022-01-16 ENCOUNTER — Other Ambulatory Visit: Payer: Self-pay | Admitting: Family Medicine

## 2022-01-16 DIAGNOSIS — E039 Hypothyroidism, unspecified: Secondary | ICD-10-CM

## 2022-02-12 ENCOUNTER — Other Ambulatory Visit: Payer: Self-pay | Admitting: Family Medicine

## 2022-02-12 DIAGNOSIS — E039 Hypothyroidism, unspecified: Secondary | ICD-10-CM

## 2022-02-14 NOTE — Telephone Encounter (Signed)
Requested medication (s) are due for refill today: yes  Requested medication (s) are on the active medication list: yes  Last refill:  01/18/22  Future visit scheduled: yes  Notes to clinic:  Man. DX Code Needed.      Requested Prescriptions  Pending Prescriptions Disp Refills   levothyroxine (SYNTHROID) 50 MCG tablet [Pharmacy Med Name: LEVOTHYROXINE 50 MCG TABLET] 90 tablet 1    Sig: TAKE 1 TABLET BY MOUTH EVERY DAY     Endocrinology:  Hypothyroid Agents Passed - 02/12/2022  4:03 AM      Passed - TSH in normal range and within 360 days    TSH  Date Value Ref Range Status  08/17/2021 4.260 0.450 - 4.500 uIU/mL Final         Passed - Valid encounter within last 12 months    Recent Outpatient Visits           6 months ago Hypothyroidism, unspecified type   [place] [facility] at [place], Deanna C, MD   12 months ago Hypothyroidism, unspecified type   [facility] & [facility] Medicine at [place], Deanna C, MD   1 year ago Hypothyroidism, unspecified type   [facility] Primary Care & Sports Medicine at [place], Deanna C, MD   2 years ago Hypothyroidism, unspecified type   [facility] Primary Care & Sports Medicine at [place], Deanna C, MD   2 years ago Establishing care with new doctor, encounter for   [place] at [place], [place], MD       Future Appointments             In 3 days Juline Patch, MD [place] at [facility], [facility]

## 2022-02-16 ENCOUNTER — Other Ambulatory Visit: Payer: Self-pay | Admitting: Family Medicine

## 2022-02-16 DIAGNOSIS — E039 Hypothyroidism, unspecified: Secondary | ICD-10-CM

## 2022-02-16 NOTE — Telephone Encounter (Signed)
Requested medication (s) are due for refill today: Due 02/18/22  Requested medication (s) are on the active medication list: yes    Last refill: 01/18/22  #30 0 refills  Future visit scheduled yes 02/17/22   Notes to clinic:Pt has appt tomorrow, please review  Requested Prescriptions  Pending Prescriptions Disp Refills   levothyroxine (SYNTHROID) 50 MCG tablet [Pharmacy Med Name: LEVOTHYROXINE 50 MCG TABLET] 30 tablet 0    Sig: TAKE 1 [place]     Endocrinology:  Hypothyroid Agents Passed - 02/16/2022  1:32 AM      Passed - TSH in normal range and within 360 days    TSH  Date Value Ref Range Status  08/17/2021 4.260 0.450 - 4.500 uIU/mL Final         Passed - Valid encounter within last 12 months    Recent Outpatient Visits           6 months ago Hypothyroidism, unspecified type   [place] [facility] at [place], Deanna C, MD   12 months ago Hypothyroidism, unspecified type   [facility] Primary Care & [facility] Medicine at [place], Deanna C, MD   1 year ago Hypothyroidism, unspecified type   [facility] Primary Care & Sports Medicine at [place], Deanna C, MD   2 years ago Hypothyroidism, unspecified type   [facility] Primary [place] at [place], Deanna C, MD   2 years ago Establishing care with new doctor, encounter for   [place] at [place], [place], MD       Future Appointments             Tomorrow Juline Patch, MD [place] at [facility], [facility]

## 2022-02-17 ENCOUNTER — Ambulatory Visit: Payer: Commercial Managed Care - PPO | Admitting: Family Medicine

## 2022-02-17 ENCOUNTER — Encounter: Payer: Self-pay | Admitting: Family Medicine

## 2022-02-17 VITALS — BP 120/78 | HR 85 | Ht 63.0 in | Wt 213.0 lb

## 2022-02-17 DIAGNOSIS — E039 Hypothyroidism, unspecified: Secondary | ICD-10-CM | POA: Diagnosis not present

## 2022-02-17 DIAGNOSIS — R5383 Other fatigue: Secondary | ICD-10-CM | POA: Diagnosis not present

## 2022-02-17 DIAGNOSIS — R739 Hyperglycemia, unspecified: Secondary | ICD-10-CM

## 2022-02-17 MED ORDER — LEVOTHYROXINE SODIUM 50 MCG PO TABS: 50.0000 ug | ORAL_TABLET | Freq: Every day | ORAL | 1 refills | Status: DC

## 2022-02-17 NOTE — Progress Notes (Signed)
Date:  02/17/2022   Name:  Emma Phillips   DOB:  15-May-1985   MRN:  [id number]   Chief Complaint: Hypothyroidism and Hyperglycemia  Hyperglycemia This is a new problem. The current episode started more than 1 month ago. The problem has been waxing and waning. Pertinent negatives include no abdominal pain, arthralgias, chills, congestion, coughing, diaphoresis, fatigue, fever, headaches, myalgias, nausea, numbness, rash, sore throat, urinary symptoms, vertigo or weakness. Nothing aggravates the symptoms. She has tried nothing for the symptoms. The treatment provided mild relief.  Thyroid Problem Presents for follow-up visit. Patient reports no anxiety, constipation, depressed mood, diaphoresis, diarrhea, dry skin, fatigue, hair loss, hoarse voice, menstrual problem, nail problem, palpitations or weight gain. The symptoms have been stable.    Lab Results  Component Value Date   NA 138 09/04/2021   K 4.0 09/04/2021   CO2 24 09/04/2021   GLUCOSE 154 (H) 09/04/2021   BUN 10 09/04/2021   CREATININE 0.71 09/04/2021   CALCIUM 8.6 (L) 09/04/2021   EGFR 105 02/17/2021   GFRNONAA >60 09/04/2021   Lab Results  Component Value Date   CHOL 160 02/17/2021   HDL 32 (L) 02/17/2021   LDLCALC 102 (H) 02/17/2021   TRIG 147 02/17/2021   Lab Results  Component Value Date   TSH 4.260 08/17/2021   No results found for: "HGBA1C" Lab Results  Component Value Date   WBC 9.1 09/04/2021   HGB 12.3 09/04/2021   HCT 36.3 09/04/2021   MCV 91.2 09/04/2021   PLT 241 09/04/2021   No results found for: "ALT", "AST", "GGT", "ALKPHOS", "BILITOT" No results found for: "25OHVITD2", "25OHVITD3", "VD25OH"   Review of Systems  Constitutional: Negative.  Negative for chills, diaphoresis, fatigue, fever, unexpected weight change and weight gain.  HENT:  Negative for congestion, ear discharge, ear pain, hoarse voice, rhinorrhea, sinus pressure, sneezing and sore throat.   Eyes:  Negative for visual  disturbance.  Respiratory:  Negative for cough, shortness of breath, wheezing and stridor.   Cardiovascular:  Negative for palpitations.  Gastrointestinal:  Negative for abdominal pain, blood in stool, constipation, diarrhea and nausea.  Endocrine: Positive for polydipsia and polyuria.  Genitourinary:  Negative for dysuria, flank pain, frequency, hematuria, menstrual problem, urgency and vaginal discharge.  Musculoskeletal:  Negative for arthralgias, back pain and myalgias.  Skin:  Negative for rash.  Neurological:  Negative for dizziness, vertigo, weakness, numbness and headaches.  Hematological:  Negative for adenopathy. Does not bruise/bleed easily.  Psychiatric/Behavioral:  Negative for dysphoric mood. The patient is not nervous/anxious.     Patient Active Problem List   Diagnosis Date Noted   Displaced spiral fracture of shaft of right tibia, initial encounter for closed fracture 09/02/2021   IUD (intrauterine device) in place 03/02/2020   Bipolar 2 disorder, major depressive episode ([facility]) 11/24/2013    Allergies  Allergen Reactions   Penicillins Anaphylaxis    Past Surgical History:  Procedure Laterality Date   COLPOSCOPY     Per patient age 37   INTRAUTERINE DEVICE (IUD) INSERTION  2018   TIBIA IM NAIL INSERTION Right 09/03/2021   Procedure: INTRAMEDULLARY (IM) NAIL TIBIAL;  Surgeon: Renee Harder, MD;  Location: [facility];  Service: Orthopedics;  Laterality: Right;    Social History   Tobacco Use   Smoking status: Former    Types: Cigarettes    Quit date: 11/19/2016    Years since quitting: 5.2   Smokeless tobacco: Never  Substance Use Topics   Alcohol use: No  Drug use: No     Medication list has been reviewed and updated.  Current Meds  Medication Sig   b complex vitamins capsule Take 1 capsule by mouth daily.   buPROPion (WELLBUTRIN XL) 300 MG 24 hr tablet Take 300 mg by mouth daily. psych   docusate sodium (COLACE) 100 MG capsule Take 1 capsule (100  mg total) by mouth daily as needed.   lamoTRIgine (LAMICTAL) 150 MG tablet Take 150 mg by mouth 2 (two) times daily. psych   levothyroxine (SYNTHROID) 50 MCG tablet TAKE 1 TABLET BY MOUTH EVERY DAY   prazosin (MINIPRESS) 2 MG capsule Take 2 mg by mouth at bedtime. Dr Lemmie Evens   vitamin B-12 (CYANOCOBALAMIN) 500 MCG tablet Take 500 mcg by mouth daily.   [DISCONTINUED] HYDROcodone-acetaminophen (NORCO) 5-325 MG tablet Take 1 tablet by mouth every 4 (four) hours as needed for moderate pain.   [DISCONTINUED] HYDROcodone-acetaminophen (NORCO/VICODIN) 5-325 MG tablet Take 1-2 tablets by mouth every 4 (four) hours as needed for moderate pain (pain score 4-6).   [DISCONTINUED] HYDROcodone-acetaminophen (NORCO/VICODIN) 5-325 MG tablet Take 1-2 tablets by mouth every 4 (four) hours as needed for moderate pain.       02/17/2022    8:00 AM 08/17/2021    8:00 AM 02/17/2021    8:01 AM 08/10/2020    8:03 AM  GAD 7 : Generalized Anxiety Score  Nervous, Anxious, on Edge 0 2 0 0  Control/stop worrying 0 0 0 0  Worry too much - different things 0 0 0 0  Trouble relaxing 0 2 0 0  Restless 0 3 0 0  Easily annoyed or irritable 0 2 0 0  Afraid - awful might happen 0 1 0 0  Total GAD 7 Score 0 10 0 0  Anxiety Difficulty Not difficult at all Very difficult Not difficult at all        02/17/2022    8:00 AM 08/17/2021    8:00 AM 02/17/2021    8:01 AM  Depression screen PHQ 2/9  Decreased Interest 0 1 0  Down, Depressed, Hopeless 0 1 0  PHQ - 2 Score 0 2 0  Altered sleeping 0 3 0  Tired, decreased energy 0 3 0  Change in appetite 0 3 0  Feeling bad or failure about yourself  0 0 0  Trouble concentrating 0 3 0  Moving slowly or fidgety/restless 0 0 0  Suicidal thoughts 0 0 0  PHQ-9 Score 0 14 0  Difficult doing work/chores Not difficult at all Very difficult Not difficult at all    BP Readings from Last 3 Encounters:  02/17/22 120/78  09/10/21 (!) 141/97  09/04/21 134/84    Physical Exam Vitals and nursing  note reviewed. Exam conducted with a chaperone present.  Constitutional:      General: She is not in acute distress.    Appearance: She is not diaphoretic.  HENT:     Head: Normocephalic and atraumatic.     Right Ear: External ear normal.     Left Ear: External ear normal.     Nose: Nose normal.  Eyes:     General:        Right eye: No discharge.        Left eye: No discharge.     Conjunctiva/sclera: Conjunctivae normal.     Pupils: Pupils are equal, round, and reactive to light.  Neck:     Thyroid: No thyromegaly.     Vascular: No JVD.  Cardiovascular:  Rate and Rhythm: Normal rate and regular rhythm.     Heart sounds: Normal heart sounds. No murmur heard.    No friction rub. No gallop.  Pulmonary:     Effort: Pulmonary effort is normal.     Breath sounds: Normal breath sounds.  Abdominal:     General: Bowel sounds are normal.     Palpations: Abdomen is soft. There is no mass.     Tenderness: There is no abdominal tenderness. There is no guarding.  Musculoskeletal:        General: Normal range of motion.     Cervical back: Normal range of motion and neck supple.  Lymphadenopathy:     Cervical: No cervical adenopathy.  Skin:    General: Skin is warm and dry.  Neurological:     Mental Status: She is alert.     Wt Readings from Last 3 Encounters:  02/17/22 213 lb (96.6 kg)  09/10/21 204 lb 12.9 oz (92.9 kg)  09/02/21 205 lb (93 kg)    BP 120/78   Pulse 85   Ht 5\' 3"  (1.6 m)   Wt 213 lb (96.6 kg)   SpO2 98%   BMI 37.73 kg/m   Assessment and Plan: 1. Hypothyroidism, unspecified type Chronic.  Controlled.  Stable.  Currently will is taking levothyroxine 50 echograms daily.  Will check TSH for continuance that current level of levothyroxine supplementation. - levothyroxine (SYNTHROID) 50 MCG tablet; Take 1 tablet (50 mcg total) by mouth daily.  Dispense: 90 tablet; Refill: 1 - TSH  2. Hyperglycemia New onset.  Patient has been relatively inactive with boot  due to multiple fractures of her right ankle.  Will check renal function panel as well as A1c and that previous glucoses have been elevated.  Will also check lipid panel for status of lipid concerns. - Renal Function Panel - HgB A1c - Lipid Panel With LDL/HDL Ratio  3. Fatigue, unspecified type Fatigue is continued and this may be related to the level of thyroid or diabetes possibility. - Renal Function Panel     Otilio Miu, MD

## 2022-02-18 LAB — RENAL FUNCTION PANEL
Albumin: 4.7 g/dL (ref 3.9–4.9)
BUN/Creatinine Ratio: 19 (ref 9–23)
BUN: 13 mg/dL (ref 6–20)
CO2: 21 mmol/L (ref 20–29)
Calcium: 9.5 mg/dL (ref 8.7–10.2)
Chloride: 102 mmol/L (ref 96–106)
Creatinine, Ser: 0.69 mg/dL (ref 0.57–1.00)
Glucose: 106 mg/dL — ABNORMAL HIGH (ref 70–99)
Phosphorus: 3.3 mg/dL (ref 3.0–4.3)
Potassium: 4.6 mmol/L (ref 3.5–5.2)
Sodium: 139 mmol/L (ref 134–144)
eGFR: 114 mL/min/{1.73_m2} (ref >59)

## 2022-02-18 LAB — LIPID PANEL WITH LDL/HDL RATIO
Cholesterol, Total: 160 mg/dL (ref 100–199)
HDL: 41 mg/dL (ref >39)
LDL Chol Calc (NIH): 102 mg/dL — ABNORMAL HIGH (ref 0–99)
LDL/HDL Ratio: 2.5 ratio (ref 0.0–3.2)
Triglycerides: 90 mg/dL (ref 0–149)
VLDL Cholesterol Cal: 17 mg/dL (ref 5–40)

## 2022-02-18 LAB — TSH: TSH: 4.3 u[IU]/mL (ref 0.450–4.500)

## 2022-02-18 LAB — HEMOGLOBIN A1C
Est. average glucose Bld gHb Est-mCnc: 108 mg/dL
Hgb A1c MFr Bld: 5.4 % (ref 4.8–5.6)

## 2022-07-08 ENCOUNTER — Telehealth: Payer: Self-pay | Admitting: Family Medicine

## 2022-07-08 NOTE — Telephone Encounter (Signed)
Copied from CRM [id number]. Topic: Referral - Question >> Jul 08, 2022  4:23 PM Haroldine Laws wrote: Reason for CRM: pt would like to get a referral to a new psychiatrist.  CB#  [phone]

## 2022-07-12 ENCOUNTER — Ambulatory Visit: Payer: Commercial Managed Care - PPO | Admitting: Family Medicine

## 2022-07-12 ENCOUNTER — Encounter: Payer: Self-pay | Admitting: Family Medicine

## 2022-07-12 VITALS — BP 124/78 | HR 75 | Ht 63.0 in | Wt 217.0 lb

## 2022-07-12 DIAGNOSIS — S0591XA Unspecified injury of right eye and orbit, initial encounter: Secondary | ICD-10-CM

## 2022-07-12 DIAGNOSIS — F5101 Primary insomnia: Secondary | ICD-10-CM

## 2022-07-12 MED ORDER — TRAZODONE HCL 50 MG PO TABS: 25.0000 mg | ORAL_TABLET | Freq: Every evening | ORAL | 3 refills | Status: DC | PRN

## 2022-07-12 NOTE — Progress Notes (Signed)
Date:  07/12/2022   Name:  Emma Phillips   DOB:  1985/12/17   MRN:  [id number]   Chief Complaint: Insomnia (Would like something for sleep to get to appt with psych 14 and 8)  Insomnia Primary symptoms: difficulty falling asleep, premature morning awakening.   The current episode started more than one month. The onset quality is gradual. The problem occurs nightly. The problem has been gradually worsening since onset. Past treatments include medication (melatonin/zquil).  Eye Pain  The right eye is affected. This is a new problem. The current episode started today. The problem has been unchanged. The injury mechanism was a direct trauma (cable hit eye). The pain is mild. There is No known exposure to pink eye. She Does not wear contacts. Associated symptoms include photophobia. Pertinent negatives include no blurred vision, double vision or fever.    Lab Results  Component Value Date   NA 139 02/17/2022   K 4.6 02/17/2022   CO2 21 02/17/2022   GLUCOSE 106 (H) 02/17/2022   BUN 13 02/17/2022   CREATININE 0.69 02/17/2022   CALCIUM 9.5 02/17/2022   EGFR 114 02/17/2022   GFRNONAA >60 09/04/2021   Lab Results  Component Value Date   CHOL 160 02/17/2022   HDL 41 02/17/2022   LDLCALC 102 (H) 02/17/2022   TRIG 90 02/17/2022   Lab Results  Component Value Date   TSH 4.300 02/17/2022   Lab Results  Component Value Date   HGBA1C 5.4 02/17/2022   Lab Results  Component Value Date   WBC 9.1 09/04/2021   HGB 12.3 09/04/2021   HCT 36.3 09/04/2021   MCV 91.2 09/04/2021   PLT 241 09/04/2021   No results found for: "ALT", "AST", "GGT", "ALKPHOS", "BILITOT" No results found for: "25OHVITD2", "25OHVITD3", "VD25OH"   Review of Systems  Constitutional:  Negative for fatigue and fever.  HENT:  Negative for congestion.   Eyes:  Positive for photophobia and pain. Negative for blurred vision and double vision.  Respiratory:  Negative for shortness of breath and wheezing.    Psychiatric/Behavioral:  The patient has insomnia.     Patient Active Problem List   Diagnosis Date Noted   Displaced spiral fracture of shaft of right tibia, initial encounter for closed fracture 09/02/2021   IUD (intrauterine device) in place 03/02/2020   Bipolar 2 disorder, major depressive episode ([facility]) 11/24/2013    Allergies  Allergen Reactions   Penicillins Anaphylaxis    Past Surgical History:  Procedure Laterality Date   COLPOSCOPY     Per patient age 37   INTRAUTERINE DEVICE (IUD) INSERTION  2018   TIBIA IM NAIL INSERTION Right 09/03/2021   Procedure: INTRAMEDULLARY (IM) NAIL TIBIAL;  Surgeon: Ross Marcus, MD;  Location: [facility];  Service: Orthopedics;  Laterality: Right;    Social History   Tobacco Use   Smoking status: Former    Types: Cigarettes    Quit date: 11/19/2016    Years since quitting: 5.6   Smokeless tobacco: Never  Substance Use Topics   Alcohol use: No   Drug use: No     Medication list has been reviewed and updated.  Current Meds  Medication Sig   b complex vitamins capsule Take 1 capsule by mouth daily.   buPROPion (WELLBUTRIN XL) 300 MG 24 hr tablet Take 300 mg by mouth daily. psych   docusate sodium (COLACE) 100 MG capsule Take 1 capsule (100 mg total) by mouth daily as needed.   lamoTRIgine (LAMICTAL) 150  MG tablet Take 150 mg by mouth 2 (two) times daily. psych   levothyroxine (SYNTHROID) 50 MCG tablet Take 1 tablet (50 mcg total) by mouth daily.   vitamin B-12 (CYANOCOBALAMIN) 500 MCG tablet Take 500 mcg by mouth daily.       07/12/2022    1:49 PM 02/17/2022    8:00 AM 08/17/2021    8:00 AM 02/17/2021    8:01 AM  GAD 7 : Generalized Anxiety Score  Nervous, Anxious, on Edge 3 0 2 0  Control/stop worrying 0 0 0 0  Worry too much - different things 0 0 0 0  Trouble relaxing 1 0 2 0  Restless 1 0 3 0  Easily annoyed or irritable 3 0 2 0  Afraid - awful might happen 0 0 1 0  Total GAD 7 Score 8 0 10 0  Anxiety Difficulty  Somewhat difficult Not difficult at all Very difficult Not difficult at all       07/12/2022    1:48 PM 02/17/2022    8:00 AM 08/17/2021    8:00 AM  Depression screen PHQ 2/9  Decreased Interest 2 0 1  Down, Depressed, Hopeless 3 0 1  PHQ - 2 Score 5 0 2  Altered sleeping 3 0 3  Tired, decreased energy 3 0 3  Change in appetite 0 0 3  Feeling bad or failure about yourself  0 0 0  Trouble concentrating 2 0 3  Moving slowly or fidgety/restless 1 0 0  Suicidal thoughts 0 0 0  PHQ-9 Score 14 0 14  Difficult doing work/chores Extremely dIfficult Not difficult at all Very difficult    BP Readings from Last 3 Encounters:  07/12/22 124/78  02/17/22 120/78  09/10/21 (!) 141/97    Physical Exam Vitals and nursing note reviewed.  Constitutional:      Appearance: She is well-developed.  HENT:     Head: Normocephalic.     Right Ear: Tympanic membrane and external ear normal.     Left Ear: Tympanic membrane and external ear normal.     Nose: Nose normal.     Mouth/Throat:     Mouth: Mucous membranes are moist.  Eyes:     General: Lids are everted, no foreign bodies appreciated. Vision grossly intact. No scleral icterus.       Right eye: No foreign body or discharge.     Extraocular Movements: Extraocular movements intact.     Conjunctiva/sclera: Conjunctivae normal.     Right eye: Right conjunctiva is not injected.     Left eye: Left conjunctiva is not injected.     Pupils: Pupils are equal, round, and reactive to light.     Comments: PERRLA  Neck:     Thyroid: No thyromegaly.     Vascular: No JVD.     Trachea: No tracheal deviation.  Cardiovascular:     Rate and Rhythm: Normal rate and regular rhythm.     Heart sounds: Normal heart sounds. No murmur heard.    No friction rub. No gallop.  Pulmonary:     Effort: Pulmonary effort is normal. No respiratory distress.     Breath sounds: Normal breath sounds. No wheezing or rales.  Abdominal:     General: Bowel sounds are normal.      Palpations: Abdomen is soft.  Musculoskeletal:        General: No tenderness. Normal range of motion.     Cervical back: Normal range of motion and neck supple.  Lymphadenopathy:     Cervical: No cervical adenopathy.  Skin:    General: Skin is warm.     Findings: No rash.  Neurological:     Mental Status: She is alert and oriented to person, place, and time.     Cranial Nerves: No cranial nerve deficit.     Deep Tendon Reflexes: Reflexes normal.  Psychiatric:        Mood and Affect: Mood is not anxious or depressed.     Wt Readings from Last 3 Encounters:  07/12/22 217 lb (98.4 kg)  02/17/22 213 lb (96.6 kg)  09/10/21 204 lb 12.9 oz (92.9 kg)    BP 124/78   Pulse 75   Ht 5\' 3"  (1.6 m)   Wt 217 lb (98.4 kg)   SpO2 96%   BMI 38.44 kg/m   Assessment and Plan:  1. Primary insomnia New onset.  Persistent.  Stable.  Unresolved on melatonin or over-the-counter sleep preparation.  We will refer to psychiatry for any further involvement if we need to go beyond initial use of trazodone 50 mg 1/2 to 1 tablet at mouth nightly. - Ambulatory referral to Psychiatry - traZODone (DESYREL) 50 MG tablet; Take 0.5-1 tablets (25-50 mg total) by mouth at bedtime as needed for sleep.  Dispense: 30 tablet; Refill: 3  2. Right eye injury, initial encounter New onset mention as a by the way patient was adjusting a cable became distracted and the cable hit her in the eye.  There is some irritation in the eye that the patient is having to rub the eye and there is some discomfort with light.  Pupils are equal reactive to light there is no evidence of foreign body even under the lid.  I suspect the patient has a corneal abrasion and rather doubt that there is any penetration.  Patient was told that she probably should go to the ER but she did not want to do so at this time.  So I have instructed her to stay in a dark room with cool compresses and over the course of the day if the pain continues or  increases she is to go to the hospital where they can evaluate for corneal abrasion.   Elizabeth Sauer, MD

## 2022-08-03 ENCOUNTER — Other Ambulatory Visit: Payer: Self-pay | Admitting: Family Medicine

## 2022-08-03 DIAGNOSIS — F5101 Primary insomnia: Secondary | ICD-10-CM

## 2022-08-18 ENCOUNTER — Encounter: Payer: Self-pay | Admitting: Family Medicine

## 2022-08-18 ENCOUNTER — Ambulatory Visit: Payer: Commercial Managed Care - PPO | Admitting: Family Medicine

## 2022-08-18 VITALS — BP 120/78 | HR 72 | Ht 63.0 in | Wt 216.0 lb

## 2022-08-18 DIAGNOSIS — F5101 Primary insomnia: Secondary | ICD-10-CM

## 2022-08-18 DIAGNOSIS — E039 Hypothyroidism, unspecified: Secondary | ICD-10-CM | POA: Diagnosis not present

## 2022-08-18 DIAGNOSIS — K429 Umbilical hernia without obstruction or gangrene: Secondary | ICD-10-CM | POA: Diagnosis not present

## 2022-08-18 MED ORDER — LEVOTHYROXINE SODIUM 50 MCG PO TABS: 50.0000 ug | ORAL_TABLET | Freq: Every day | ORAL | 1 refills | Status: DC

## 2022-08-18 MED ORDER — TRAZODONE HCL 50 MG PO TABS: 50.0000 mg | ORAL_TABLET | Freq: Every day | ORAL | 1 refills | Status: DC

## 2022-08-18 NOTE — Progress Notes (Signed)
Date:  08/18/2022   Name:  Emma Phillips   DOB:  1985/06/23   MRN:  [id number]   Chief Complaint: Hypothyroidism and Insomnia  Patient is a 37 year old female who presents for a comprehensive physical exam. The patient reports the following problems: umbilical hernia Health maintenance has been reviewed up to date.      Insomnia Primary symptoms: no fragmented sleep, no sleep disturbance, difficulty falling asleep, no somnolence, no frequent awakening, no premature morning awakening, no malaise/fatigue, no napping.   The current episode started more than one month. The onset quality is gradual. The problem occurs intermittently. The problem has been gradually improving since onset. The symptoms are relieved by medication (trazadone 50mg ). Past treatments include medication. The treatment provided moderate relief. PMH includes: no hypertension.   Thyroid Problem Presents for follow-up visit. Patient reports no anxiety, cold intolerance, constipation, depressed mood, diarrhea or fatigue. The symptoms have been stable.    Lab Results  Component Value Date   NA 139 02/17/2022   K 4.6 02/17/2022   CO2 21 02/17/2022   GLUCOSE 106 (H) 02/17/2022   BUN 13 02/17/2022   CREATININE 0.69 02/17/2022   CALCIUM 9.5 02/17/2022   EGFR 114 02/17/2022   GFRNONAA >60 09/04/2021   Lab Results  Component Value Date   CHOL 160 02/17/2022   HDL 41 02/17/2022   LDLCALC 102 (H) 02/17/2022   TRIG 90 02/17/2022   Lab Results  Component Value Date   TSH 4.300 02/17/2022   Lab Results  Component Value Date   HGBA1C 5.4 02/17/2022   Lab Results  Component Value Date   WBC 9.1 09/04/2021   HGB 12.3 09/04/2021   HCT 36.3 09/04/2021   MCV 91.2 09/04/2021   PLT 241 09/04/2021   No results found for: "ALT", "AST", "GGT", "ALKPHOS", "BILITOT" No results found for: "25OHVITD2", "25OHVITD3", "VD25OH"   Review of Systems  Constitutional: Negative.  Negative for chills, fatigue, fever,  malaise/fatigue and unexpected weight change.  HENT:  Negative for congestion, ear discharge, ear pain, rhinorrhea, sinus pressure, sneezing and sore throat.   Respiratory:  Negative for cough, shortness of breath, wheezing and stridor.   Cardiovascular:  Negative for chest pain.  Gastrointestinal:  Positive for abdominal pain. Negative for blood in stool, constipation, diarrhea and nausea.       Per hernia  Endocrine: Negative for cold intolerance.  Genitourinary:  Negative for dysuria, flank pain, frequency, hematuria, urgency and vaginal discharge.  Musculoskeletal:  Negative for arthralgias, back pain and myalgias.  Skin:  Negative for rash.  Neurological:  Negative for dizziness, weakness and headaches.  Hematological:  Negative for adenopathy. Does not bruise/bleed easily.  Psychiatric/Behavioral:  Negative for dysphoric mood and sleep disturbance. The patient has insomnia. The patient is not nervous/anxious.     Patient Active Problem List   Diagnosis Date Noted   Displaced spiral fracture of shaft of right tibia, initial encounter for closed fracture 09/02/2021   IUD (intrauterine device) in place 03/02/2020   Bipolar 2 disorder, major depressive episode (HCC) 11/24/2013    Allergies  Allergen Reactions   Penicillins Anaphylaxis    Past Surgical History:  Procedure Laterality Date   COLPOSCOPY     Per patient age 81   INTRAUTERINE DEVICE (IUD) INSERTION  2018   TIBIA IM NAIL INSERTION Right 09/03/2021   Procedure: INTRAMEDULLARY (IM) NAIL TIBIAL;  Surgeon: Ross Marcus, MD;  Location: [facility] ORS;  Service: Orthopedics;  Laterality: Right;    Social  History   Tobacco Use   Smoking status: Former    Current packs/day: 0.00    Types: Cigarettes    Quit date: 11/19/2016    Years since quitting: 5.7   Smokeless tobacco: Never  Substance Use Topics   Alcohol use: No   Drug use: No     Medication list has been reviewed and updated.  Current Meds  Medication Sig    b complex vitamins capsule Take 1 capsule by mouth daily.   buPROPion (WELLBUTRIN XL) 300 MG 24 hr tablet Take 300 mg by mouth daily. psych   docusate sodium (COLACE) 100 MG capsule Take 1 capsule (100 mg total) by mouth daily as needed.   lamoTRIgine (LAMICTAL) 150 MG tablet Take 150 mg by mouth 2 (two) times daily. psych   levothyroxine (SYNTHROID) 50 MCG tablet Take 1 tablet (50 mcg total) by mouth daily.   prazosin (MINIPRESS) 2 MG capsule Take 2 mg by mouth at bedtime. Dr Rexene Edison   traZODone (DESYREL) 50 MG tablet TAKE 0.5-1 TABLETS BY MOUTH AT BEDTIME AS NEEDED FOR SLEEP.   vitamin B-12 (CYANOCOBALAMIN) 500 MCG tablet Take 500 mcg by mouth daily.       08/18/2022    9:16 AM 07/12/2022    1:49 PM 02/17/2022    8:00 AM 08/17/2021    8:00 AM  GAD 7 : Generalized Anxiety Score  Nervous, Anxious, on Edge 3 3 0 2  Control/stop worrying 0 0 0 0  Worry too much - different things 0 0 0 0  Trouble relaxing 0 1 0 2  Restless 1 1 0 3  Easily annoyed or irritable 3 3 0 2  Afraid - awful might happen 0 0 0 1  Total GAD 7 Score 7 8 0 10  Anxiety Difficulty Somewhat difficult Somewhat difficult Not difficult at all Very difficult       08/18/2022    9:16 AM 07/12/2022    1:48 PM 02/17/2022    8:00 AM  Depression screen PHQ 2/9  Decreased Interest 0 2 0  Down, Depressed, Hopeless 0 3 0  PHQ - 2 Score 0 5 0  Altered sleeping 0 3 0  Tired, decreased energy 3 3 0  Change in appetite 0 0 0  Feeling bad or failure about yourself  0 0 0  Trouble concentrating 0 2 0  Moving slowly or fidgety/restless 0 1 0  Suicidal thoughts 0 0 0  PHQ-9 Score 3 14 0  Difficult doing work/chores Not difficult at all Extremely dIfficult Not difficult at all    BP Readings from Last 3 Encounters:  08/18/22 120/78  07/12/22 124/78  02/17/22 120/78    Physical Exam Vitals and nursing note reviewed. Exam conducted with a chaperone present.  Constitutional:      General: She is not in acute distress.     Appearance: She is not diaphoretic.  HENT:     Head: Normocephalic and atraumatic.     Right Ear: Tympanic membrane and external ear normal.     Left Ear: Tympanic membrane and external ear normal.     Nose: Nose normal.     Mouth/Throat:     Mouth: Mucous membranes are moist.  Eyes:     General:        Right eye: No discharge.        Left eye: No discharge.     Conjunctiva/sclera: Conjunctivae normal.     Pupils: Pupils are equal, round, and reactive to light.  Neck:     Thyroid: No thyromegaly.     Vascular: No JVD.  Cardiovascular:     Rate and Rhythm: Normal rate and regular rhythm.     Heart sounds: Normal heart sounds. No murmur heard.    No friction rub. No gallop.  Pulmonary:     Effort: Pulmonary effort is normal.     Breath sounds: Normal breath sounds. No wheezing, rhonchi or rales.  Abdominal:     General: Bowel sounds are normal.     Palpations: Abdomen is soft. There is no mass.     Tenderness: There is no abdominal tenderness. There is no guarding.  Musculoskeletal:        General: Normal range of motion.     Cervical back: Normal range of motion and neck supple.  Lymphadenopathy:     Cervical: No cervical adenopathy.  Skin:    General: Skin is warm and dry.  Neurological:     Mental Status: She is alert.     Deep Tendon Reflexes: Reflexes are normal and symmetric.     Wt Readings from Last 3 Encounters:  08/18/22 216 lb (98 kg)  07/12/22 217 lb (98.4 kg)  02/17/22 213 lb (96.6 kg)    BP 120/78   Pulse 72   Ht 5\' 3"  (1.6 m)   Wt 216 lb (98 kg)   SpO2 97%   BMI 38.26 kg/m   Assessment and Plan:  1. Hypothyroidism, unspecified type Chronic.  Controlled.  Stable.  Currently is on levothyroxine 50 mcg daily.  Will refill that dosing but this may change pending TSH evaluation.  Will recheck in 1 year - levothyroxine (SYNTHROID) 50 MCG tablet; Take 1 tablet (50 mcg total) by mouth daily.  Dispense: 90 tablet; Refill: 1 - TSH - Comprehensive  Metabolic Panel (CMET)  2. Primary insomnia Chronic.  Controlled.  Stable.  Asymptomatic.  Patient is having results with trazodone 50 mg nightly.  Will recheck in 6 months. - traZODone (DESYREL) 50 MG tablet; Take 1 tablet (50 mg total) by mouth at bedtime.  Dispense: 90 tablet; Refill: 1  3. Umbilical hernia without obstruction and without gangrene Chronic.  Newly brought to my attention.  Patient has had discomfort and only partial reduction even when lying supine.  This is in the umbilicus and has been since the delivery of her last child.  We will refer to general surgery for evaluation and possible repair. - Ambulatory referral to General Surgery    Elizabeth Sauer, MD

## 2022-08-22 ENCOUNTER — Ambulatory Visit: Payer: Commercial Managed Care - PPO | Admitting: Surgery

## 2022-08-29 ENCOUNTER — Encounter: Payer: Self-pay | Admitting: Surgery

## 2022-08-29 ENCOUNTER — Ambulatory Visit: Payer: Commercial Managed Care - PPO | Admitting: Surgery

## 2022-08-29 VITALS — BP 142/88 | HR 74 | Temp 98.0°F | Ht 63.0 in | Wt 214.0 lb

## 2022-08-29 DIAGNOSIS — K5909 Other constipation: Secondary | ICD-10-CM

## 2022-08-29 DIAGNOSIS — K420 Umbilical hernia with obstruction, without gangrene: Secondary | ICD-10-CM | POA: Diagnosis not present

## 2022-08-29 NOTE — Progress Notes (Signed)
08/29/2022  Reason for Visit:  Incarcerated umbilical hernia  Requesting Provider:  Elizabeth Sauer, MD  History of Present Illness: Emma Phillips is a 37 y.o. female presenting for evaluation of an incarcerated umbilical hernia.  The patient reports she's had this for about 9 years since her last pregnancy.  Initially was not an issue, but over the past two years, she has noticed the hernia becoming more painful.  It is bulging out more of the time and now it does not reduce.  She has tried pushing it but results in some discomfort when doing so.  She also has had issues with chronic constipation for about 9 years as well.  She sometimes will go about two weeks without a bowel movement.  She uses laxatives at times, and sometimes they work, sometimes they don't.  Denies any nausea, vomiting.  Feels pressure/pain in the periumbilical area, without any radiation.  No overlying skin changes.  Past Medical History: Past Medical History:  Diagnosis Date   Bipolar 1 disorder (HCC)    Insomnia      Past Surgical History: Past Surgical History:  Procedure Laterality Date   COLPOSCOPY     Per patient age 79   INTRAUTERINE DEVICE (IUD) INSERTION  2018   TIBIA IM NAIL INSERTION Right 09/03/2021   Procedure: INTRAMEDULLARY (IM) NAIL TIBIAL;  Surgeon: Ross Marcus, MD;  Location: [facility] ORS;  Service: Orthopedics;  Laterality: Right;    Home Medications: Prior to Admission medications   Medication Sig Start Date End Date Taking? Authorizing Provider  b complex vitamins capsule Take 1 capsule by mouth daily.   Yes [provider]  buPROPion (WELLBUTRIN XL) 300 MG 24 hr tablet Take 300 mg by mouth daily. psych 07/29/21  Yes [provider]  docusate sodium (COLACE) 100 MG capsule Take 1 capsule (100 mg total) by mouth daily as needed. 09/04/21 09/04/22 Yes Lyndle Herrlich, MD  lamoTRIgine (LAMICTAL) 150 MG tablet Take 150 mg by mouth 2 (two) times daily. psych 08/03/21  Yes  [provider]  levothyroxine (SYNTHROID) 50 MCG tablet Take 1 tablet (50 mcg total) by mouth daily. 08/18/22  Yes Duanne Limerick, MD  prazosin (MINIPRESS) 2 MG capsule Take 2 mg by mouth at bedtime. Dr Rexene Edison 02/06/21  Yes [provider]  traZODone (DESYREL) 50 MG tablet Take 1 tablet (50 mg total) by mouth at bedtime. 08/18/22  Yes Duanne Limerick, MD  vitamin B-12 (CYANOCOBALAMIN) 500 MCG tablet Take 500 mcg by mouth daily.   Yes [provider]    Allergies: Allergies  Allergen Reactions   Penicillins Anaphylaxis    Social History:  reports that she quit smoking about 5 years ago. Her smoking use included cigarettes. She has been exposed to tobacco smoke. She has never used smokeless tobacco. She reports that she does not drink alcohol and does not use drugs.   Family History: Family History  Problem Relation Age of Onset   Cancer Father    Thyroid disease Mother    Breast cancer Paternal Grandmother    Cancer Paternal Grandmother    Cancer Maternal Grandmother    Heart disease Maternal Grandfather     Review of Systems: Review of Systems  Constitutional:  Negative for chills and fever.  HENT:  Negative for hearing loss.   Respiratory:  Negative for shortness of breath.   Cardiovascular:  Negative for chest pain.  Gastrointestinal:  Positive for abdominal pain and constipation. Negative for nausea and vomiting.  Genitourinary:  Negative for dysuria.  Musculoskeletal:  Negative for myalgias.  Skin:  Negative for rash.  Neurological:  Negative for dizziness.  Psychiatric/Behavioral:  Negative for depression.     Physical Exam BP (!) 142/88   Pulse 74   Temp 98 F (36.7 C)   Ht 5\' 3"  (1.6 m)   Wt 214 lb (97.1 kg)   LMP 08/20/2022 (Exact Date)   SpO2 99%   BMI 37.91 kg/m  CONSTITUTIONAL: No acute distress HEENT:  Normocephalic, atraumatic, extraocular motion intact. NECK: Trachea is midline, and there is no jugular venous distension.   RESPIRATORY:  Lungs are clear, and breath sounds are equal bilaterally. Normal respiratory effort without pathologic use of accessory muscles. CARDIOVASCULAR: Heart is regular without murmurs, gallops, or rubs. GI: The abdomen is soft, obese, non-distended, with mild tenderness to palpation in the periumbilical area.  The patient has two adjacent hernia defects at the umbilicus.  The larger, inferior defect is reducible, and the smaller, superior defect is incarcerated.  Both appear to contain fatty tissue.  MUSCULOSKELETAL:  Normal muscle strength and tone in all four extremities.  No peripheral edema or cyanosis. SKIN: Skin turgor is normal. There are no pathologic skin lesions.  NEUROLOGIC:  Motor and sensation is grossly normal.  Cranial nerves are grossly intact. PSYCH:  Alert and oriented to person, place and time. Affect is normal.  Laboratory Analysis: Labs from 08/18/22: Na 139, K 4.5, Cl 103, CO2 21, BUN 13, Cr 0.71.  LFTs within normal.  TSH 5.01  Imaging: No results found.  Assessment and Plan: This is a 37 y.o. female with an incarcerated umbilical hernia.  --Discussed with the patient the findings on exam today, showing two adjacent umbilical hernia defects, one that is incarcerated and one reducible.  Both overall measure < 3 cm.  Discussed with her the options for surgical repair including open vs robotic surgery.  I think with her chronic constipation, it may be more difficult to do her surgery if there is less space to work with intra-abdominally.  As such, recommended to proceed with open umbilical hernia repair.  She's in agreement. --Discussed with her then the plan for open umbilical hernia repair with mesh.  Reviewed the surgery at length with her, including the planned incision, the risks of bleeding, infection, injury to surrounding structures, the use of mesh to reinforce the repair, that this would be an outpatient surgery, post-operative activity restrictions, pain  control, and she's willing to proceed. --Will schedule her surgery for 09/08/22.  All of her questions have been answered.  I spent 40 minutes dedicated to the care of this patient on the date of this encounter to include pre-visit review of records, face-to-face time with the patient discussing diagnosis and management, and any post-visit coordination of care.   Howie Ill, MD [facility]

## 2022-08-29 NOTE — Patient Instructions (Addendum)
We have sent a referral to Gastroenterology for them to see you for your chronic constipation. They will call you to schedule this appointment.  ____________________________________________________________  You have requested for your Umbilical Hernia be repaired. This will be scheduled with Dr. Aleen Campi at [facility].   Please see your (blue)pre-care sheet for information. Our surgery scheduler will call you to verify surgery date and to go over information.   You will need to arrange to be off work for 1-2 weeks but will have to have a lifting restriction of no more than 15 lbs for 6 weeks following your surgery. If you have FMLA or disability paperwork that needs filled out you may drop this off at our office or this can be faxed to [phone].     Umbilical Hernia, Adult A hernia is a bulge of tissue that pushes through an opening between muscles. An umbilical hernia happens in the abdomen, near the belly button (umbilicus). The hernia may contain tissues from the small intestine, large intestine, or fatty tissue covering the intestines (omentum). Umbilical hernias in adults tend to get worse over time, and they require surgical treatment. There are several types of umbilical hernias. You may have: A hernia located just above or below the umbilicus (indirect hernia). This is the most common type of umbilical hernia in adults. A hernia that forms through an opening formed by the umbilicus (direct hernia). A hernia that comes and goes (reducible hernia). A reducible hernia may be visible only when you strain, lift something heavy, or cough. This type of hernia can be pushed back into the abdomen (reduced). A hernia that traps abdominal tissue inside the hernia (incarcerated hernia). This type of hernia cannot be reduced. A hernia that cuts off blood flow to the tissues inside the hernia (strangulated hernia). The tissues can start to die if this happens. This type of  hernia requires emergency treatment.  What are the causes? An umbilical hernia happens when tissue inside the abdomen presses on a weak area of the abdominal muscles. What increases the risk? You may have a greater risk of this condition if you: Are obese. Have had several pregnancies. Have a buildup of fluid inside your abdomen (ascites). Have had surgery that weakens the abdominal muscles.  What are the signs or symptoms? The main symptom of this condition is a painless bulge at or near the belly button. A reducible hernia may be visible only when you strain, lift something heavy, or cough. Other symptoms may include: Dull pain. A feeling of pressure.  Symptoms of a strangulated hernia may include: Pain that gets increasingly worse. Nausea and vomiting. Pain when pressing on the hernia. Skin over the hernia becoming red or purple. Constipation. Blood in the stool.  How is this diagnosed? This condition may be diagnosed based on: A physical exam. You may be asked to cough or strain while standing. These actions increase the pressure inside your abdomen and force the hernia through the opening in your muscles. Your health care provider may try to reduce the hernia by pressing on it. Your symptoms and medical history.  How is this treated? Surgery is the only treatment for an umbilical hernia. Surgery for a strangulated hernia is done as soon as possible. If you have a small hernia that is not incarcerated, you may need to lose weight before having surgery. Follow these instructions at home: Lose weight, if told by your health care provider. Do not try to push the hernia  back in. Watch your hernia for any changes in color or size. Tell your health care provider if any changes occur. You may need to avoid activities that increase pressure on your hernia. Do not lift anything that is heavier than 10 lb (4.5 kg) until your health care provider says that this is safe. Take  over-the-counter and prescription medicines only as told by your health care provider. Keep all follow-up visits as told by your health care provider. This is important. Contact a health care provider if: Your hernia gets larger. Your hernia becomes painful. Get help right away if: You develop sudden, severe pain near the area of your hernia. You have pain as well as nausea or vomiting. You have pain and the skin over your hernia changes color. You develop a fever. This information is not intended to replace advice given to you by your health care provider. Make sure you discuss any questions you have with your health care provider. Document Released: 06/05/2015 Document Revised: 09/06/2015 Document Reviewed: 06/05/2015 Elsevier Interactive Patient Education  [employer].

## 2022-08-30 ENCOUNTER — Telehealth: Payer: Self-pay | Admitting: Surgery

## 2022-08-30 NOTE — Telephone Encounter (Signed)
Left message for patient to call, please inform her of the following regarding scheduled surgery with Dr. Aleen Campi.    Pre-Admission date/time, and Surgery date at [facility].  Surgery Date: 09/08/22 Preadmission Testing Date: 09/01/22 (phone 8a-1p)  Also patient will need to call at [phone], between 1-3:00pm the day before surgery, to find out what time to arrive for surgery.

## 2022-08-31 NOTE — Telephone Encounter (Signed)
Called patient back. As it turns out, she wants to cancel surgery.  She contacted her health insurance and she will be responsible for $3,000.00 or better after they pay. She wants to wait until next year. Will call us back.  Surgery cancelled at patient's request.

## 2022-09-01 ENCOUNTER — Inpatient Hospital Stay: Admission: RE | Admit: 2022-09-01 | Payer: Commercial Managed Care - PPO | Source: Ambulatory Visit

## 2022-09-08 ENCOUNTER — Ambulatory Visit: Admission: RE | Admit: 2022-09-08 | Payer: Commercial Managed Care - PPO | Source: Home / Self Care | Admitting: Surgery

## 2022-09-08 ENCOUNTER — Encounter: Admission: RE | Payer: Self-pay | Source: Home / Self Care

## 2022-09-08 SURGERY — HERNIA REPAIR UMBILICAL ADULT
Anesthesia: General

## 2022-10-07 ENCOUNTER — Encounter: Payer: Self-pay | Admitting: Family Medicine

## 2022-10-07 ENCOUNTER — Ambulatory Visit: Payer: Commercial Managed Care - PPO | Admitting: Family Medicine

## 2022-10-07 VITALS — BP 120/70 | HR 78 | Ht 63.0 in | Wt 215.0 lb

## 2022-10-07 DIAGNOSIS — Z23 Encounter for immunization: Secondary | ICD-10-CM

## 2022-10-07 DIAGNOSIS — F5101 Primary insomnia: Secondary | ICD-10-CM | POA: Diagnosis not present

## 2022-10-07 DIAGNOSIS — E039 Hypothyroidism, unspecified: Secondary | ICD-10-CM | POA: Diagnosis not present

## 2022-10-07 MED ORDER — TRAZODONE HCL 50 MG PO TABS
50.0000 mg | ORAL_TABLET | Freq: Every day | ORAL | 1 refills | Status: DC
Start: 2022-10-07 — End: 2023-02-24

## 2022-10-07 NOTE — Progress Notes (Signed)
Date:  10/07/2022   Name:  Emma Phillips   DOB:  Feb 16, 1985   MRN:  [id number]   Chief Complaint: Hypothyroidism (Will send in tomorrow if stays same) and Insomnia  Insomnia Primary symptoms: no fragmented sleep, no sleep disturbance, no difficulty falling asleep, no frequent awakening.   The problem occurs intermittently. The problem has been gradually improving since onset. Past treatments include medication. The treatment provided moderate relief.  Thyroid Problem Presents for follow-up visit. Patient reports no anxiety, cold intolerance, constipation, depressed mood, diaphoresis, fatigue, hair loss, nail problem, palpitations or weight gain. The symptoms have been stable.    Lab Results  Component Value Date   NA 139 08/18/2022   K 4.5 08/18/2022   CO2 21 08/18/2022   GLUCOSE 109 (H) 08/18/2022   BUN 13 08/18/2022   CREATININE 0.71 08/18/2022   CALCIUM 9.4 08/18/2022   EGFR 112 08/18/2022   GFRNONAA >60 09/04/2021   Lab Results  Component Value Date   CHOL 160 02/17/2022   HDL 41 02/17/2022   LDLCALC 102 (H) 02/17/2022   TRIG 90 02/17/2022   Lab Results  Component Value Date   TSH 5.010 (H) 08/18/2022   Lab Results  Component Value Date   HGBA1C 5.4 02/17/2022   Lab Results  Component Value Date   WBC 9.1 09/04/2021   HGB 12.3 09/04/2021   HCT 36.3 09/04/2021   MCV 91.2 09/04/2021   PLT 241 09/04/2021   Lab Results  Component Value Date   ALT 20 08/18/2022   AST 22 08/18/2022   ALKPHOS 95 08/18/2022   BILITOT 0.3 08/18/2022   No results found for: "25OHVITD2", "25OHVITD3", "VD25OH"   Review of Systems  Constitutional:  Negative for diaphoresis, fatigue and weight gain.  HENT:  Negative for trouble swallowing.   Respiratory:  Negative for cough, chest tightness and shortness of breath.   Cardiovascular:  Negative for palpitations and leg swelling.  Gastrointestinal:  Negative for abdominal pain, blood in stool and constipation.   Endocrine: Negative for cold intolerance.  Psychiatric/Behavioral:  Negative for sleep disturbance. The patient has insomnia. The patient is not nervous/anxious.     Patient Active Problem List   Diagnosis Date Noted   Displaced spiral fracture of shaft of right tibia, initial encounter for closed fracture 09/02/2021   IUD (intrauterine device) in place 03/02/2020   Bipolar 2 disorder, major depressive episode (HCC) 11/24/2013    Allergies  Allergen Reactions   Penicillins Anaphylaxis    Past Surgical History:  Procedure Laterality Date   COLPOSCOPY     Per patient age 68   INTRAUTERINE DEVICE (IUD) INSERTION  2018   TIBIA IM NAIL INSERTION Right 09/03/2021   Procedure: INTRAMEDULLARY (IM) NAIL TIBIAL;  Surgeon: Ross Marcus, MD;  Location: [facility] ORS;  Service: Orthopedics;  Laterality: Right;    Social History   Tobacco Use   Smoking status: Former    Current packs/day: 0.00    Types: Cigarettes    Quit date: 11/19/2016    Years since quitting: 5.8    Passive exposure: Past   Smokeless tobacco: Never  Vaping Use   Vaping status: Never Used  Substance Use Topics   Alcohol use: No   Drug use: No     Medication list has been reviewed and updated.  Current Meds  Medication Sig   b complex vitamins capsule Take 1 capsule by mouth daily.   buPROPion (WELLBUTRIN XL) 300 MG 24 hr tablet Take 300 mg by  mouth daily. psych   lamoTRIgine (LAMICTAL) 150 MG tablet Take 150 mg by mouth 2 (two) times daily. psych   levothyroxine (SYNTHROID) 50 MCG tablet Take 1 tablet (50 mcg total) by mouth daily. (Patient taking differently: Take 75 mcg by mouth daily.)   prazosin (MINIPRESS) 2 MG capsule Take 2 mg by mouth at bedtime. Dr Rexene Edison   traZODone (DESYREL) 50 MG tablet Take 1 tablet (50 mg total) by mouth at bedtime.   vitamin B-12 (CYANOCOBALAMIN) 500 MCG tablet Take 500 mcg by mouth daily.       10/07/2022   10:08 AM 08/18/2022    9:16 AM 07/12/2022    1:49 PM 02/17/2022    8:00  AM  GAD 7 : Generalized Anxiety Score  Nervous, Anxious, on Edge 0 3 3 0  Control/stop worrying 0 0 0 0  Worry too much - different things 0 0 0 0  Trouble relaxing 0 0 1 0  Restless 0 1 1 0  Easily annoyed or irritable 0 3 3 0  Afraid - awful might happen 0 0 0 0  Total GAD 7 Score 0 7 8 0  Anxiety Difficulty Not difficult at all Somewhat difficult Somewhat difficult Not difficult at all       10/07/2022   10:08 AM 08/18/2022    9:16 AM 07/12/2022    1:48 PM  Depression screen PHQ 2/9  Decreased Interest 0 0 2  Down, Depressed, Hopeless 0 0 3  PHQ - 2 Score 0 0 5  Altered sleeping 0 0 3  Tired, decreased energy 0 3 3  Change in appetite 0 0 0  Feeling bad or failure about yourself  0 0 0  Trouble concentrating 0 0 2  Moving slowly or fidgety/restless 0 0 1  Suicidal thoughts 0 0 0  PHQ-9 Score 0 3 14  Difficult doing work/chores Not difficult at all Not difficult at all Extremely dIfficult    BP Readings from Last 3 Encounters:  10/07/22 120/70  08/29/22 (!) 142/88  08/18/22 120/78    Physical Exam Vitals and nursing note reviewed. Exam conducted with a chaperone present.  Constitutional:      General: She is not in acute distress.    Appearance: She is not diaphoretic.  HENT:     Head: Normocephalic and atraumatic.     Right Ear: Tympanic membrane, ear canal and external ear normal. There is no impacted cerumen.     Left Ear: Tympanic membrane, ear canal and external ear normal. There is no impacted cerumen.     Nose: Nose normal. No congestion or rhinorrhea.     Mouth/Throat:     Mouth: Mucous membranes are moist.     Pharynx: No oropharyngeal exudate or posterior oropharyngeal erythema.  Eyes:     General:        Right eye: No discharge.        Left eye: No discharge.     Conjunctiva/sclera: Conjunctivae normal.     Pupils: Pupils are equal, round, and reactive to light.  Neck:     Thyroid: No thyromegaly.     Vascular: No JVD.  Cardiovascular:     Rate  and Rhythm: Normal rate and regular rhythm.     Heart sounds: Normal heart sounds. No murmur heard.    No friction rub. No gallop.  Pulmonary:     Effort: Pulmonary effort is normal.     Breath sounds: Normal breath sounds. No wheezing, rhonchi or rales.  Abdominal:     General: Bowel sounds are normal. There is no distension.     Palpations: Abdomen is soft. There is no mass.     Tenderness: There is no abdominal tenderness. There is no guarding.  Musculoskeletal:        General: Normal range of motion.     Cervical back: Normal range of motion and neck supple.  Lymphadenopathy:     Cervical: No cervical adenopathy.  Skin:    General: Skin is warm and dry.  Neurological:     Mental Status: She is alert.     Deep Tendon Reflexes: Reflexes are normal and symmetric.     Wt Readings from Last 3 Encounters:  10/07/22 215 lb (97.5 kg)  08/29/22 214 lb (97.1 kg)  08/18/22 216 lb (98 kg)    BP 120/70   Pulse 78   Ht 5\' 3"  (1.6 m)   Wt 215 lb (97.5 kg)   SpO2 97%   BMI 38.09 kg/m   Assessment and Plan: 1. Hypothyroidism, unspecified type Chronic.  Controlled.  Stable.  Asymptomatic.  Tolerating current dosing of levothyroxine 75 mcg 50+25.  Will check TSH and if comparable range we will likely switch to 75 mcg levothyroxine. - TSH  2. Primary insomnia Chronic.  Controlled.  Stable.  Continue trazodone 50 mg nightly. - traZODone (DESYREL) 50 MG tablet; Take 1 tablet (50 mg total) by mouth at bedtime.  Dispense: 90 tablet; Refill: 1  3. Need for immunization against influenza Discussed and administered. - Flu vaccine trivalent PF, 6mos and older(Flulaval,Afluria,Fluarix,Fluzone)     Elizabeth Sauer, MD

## 2022-10-08 LAB — TSH: TSH: 2.24 u[IU]/mL (ref 0.450–4.500)

## 2022-10-09 ENCOUNTER — Encounter: Payer: Self-pay | Admitting: Family Medicine

## 2022-10-17 ENCOUNTER — Ambulatory Visit (INDEPENDENT_AMBULATORY_CARE_PROVIDER_SITE_OTHER): Payer: Commercial Managed Care - PPO | Admitting: Psychiatry

## 2022-10-17 ENCOUNTER — Encounter: Payer: Self-pay | Admitting: Psychiatry

## 2022-10-17 VITALS — BP 126/80 | HR 95 | Temp 98.3°F | Ht 63.0 in | Wt 214.8 lb

## 2022-10-17 DIAGNOSIS — F1021 Alcohol dependence, in remission: Secondary | ICD-10-CM

## 2022-10-17 DIAGNOSIS — F4310 Post-traumatic stress disorder, unspecified: Secondary | ICD-10-CM | POA: Insufficient documentation

## 2022-10-17 DIAGNOSIS — R41840 Attention and concentration deficit: Secondary | ICD-10-CM | POA: Insufficient documentation

## 2022-10-17 DIAGNOSIS — F39 Unspecified mood [affective] disorder: Secondary | ICD-10-CM | POA: Diagnosis not present

## 2022-10-17 DIAGNOSIS — F1291 Cannabis use, unspecified, in remission: Secondary | ICD-10-CM

## 2022-10-17 DIAGNOSIS — F1421 Cocaine dependence, in remission: Secondary | ICD-10-CM

## 2022-10-17 DIAGNOSIS — Z79899 Other long term (current) drug therapy: Secondary | ICD-10-CM | POA: Insufficient documentation

## 2022-10-17 DIAGNOSIS — F1591 Other stimulant use, unspecified, in remission: Secondary | ICD-10-CM

## 2022-10-17 MED ORDER — DIVALPROEX SODIUM ER 500 MG PO TB24
500.0000 mg | ORAL_TABLET | Freq: Every day | ORAL | 1 refills | Status: DC
Start: 2022-10-17 — End: 2022-12-13

## 2022-10-17 MED ORDER — DIVALPROEX SODIUM ER 250 MG PO TB24
250.0000 mg | ORAL_TABLET | Freq: Every day | ORAL | 1 refills | Status: DC
Start: 2022-10-17 — End: 2022-12-12

## 2022-10-17 MED ORDER — LAMOTRIGINE 150 MG PO TABS: 75.0000 mg | ORAL_TABLET | ORAL | Status: DC

## 2022-10-17 NOTE — Patient Instructions (Addendum)
Start weaning of lamotrigine.  Start taking lamotrigine 150 mg daily for 5 days and then take 75 mg which is half of the 150 mg daily for another 5 days and stop taking it.  Start taking Depakote extended release 750 mg daily with supper.  After 5 to 6 days of being on Depakote please go in the morning to [facility] lab and get labs completed.  All the lab orders are in the system.   Divalproex Sodium Delayed- or Extended-Release Tablets What is this medication? DIVALPROEX SODIUM (dye VAL pro ex SO dee um) prevents and controls seizures in people with epilepsy. It may also be used to prevent migraine headaches. It can also be used to treat bipolar disorder. It works by calming overactive nerves in your body. This medicine may be used for other purposes; ask your health care provider or pharmacist if you have questions. COMMON BRAND NAME(S): Depakote, Depakote ER What should I tell my care team before I take this medication? They need to know if you have any of these conditions: Frequently drink alcohol Kidney disease Liver disease Low platelet counts Mitochondrial disease Suicidal thoughts, plans, or attempt by you or a family member Urea cycle disorder (UCD) An unusual or allergic reaction to divalproex sodium, sodium valproate, valproic acid, other medications, foods, dyes, or preservatives Pregnant or trying to get pregnant Breast-feeding How should I use this medication? Take this medication by mouth with a drink of water. Follow the directions on the prescription label. Do not cut, crush or chew this medication. You can take it with or without food. If it upsets your stomach, take it with food. Take your medication at regular intervals. Do not take it more often than directed. Do not stop taking except on your care team's advice. A special MedGuide will be given to you by the pharmacist with each prescription and refill. Be sure to read this information carefully each time. Talk to  your care team about the use of this medication in children. While this medication may be prescribed for children as young as 10 years for selected conditions, precautions do apply. Overdosage: If you think you have taken too much of this medicine contact a poison control center or emergency room at once. NOTE: This medicine is only for you. Do not share this medicine with others. What if I miss a dose? If you miss a dose, take it as soon as you can. If it is almost time for your next dose, take only that dose. Do not take double or extra doses. What may interact with this medication? Do not take this medication with any of the following: Sodium phenylbutyrate This medication may also interact with the following: Aspirin Certain antibiotics, such as ertapenem, imipenem, meropenem Certain medications for depression, anxiety, or other mental health conditions Certain medications for seizures, such as cannabidiol, carbamazepine, clonazepam, diazepam, ethosuximide, felbamate, lamotrigine, phenobarbital, phenytoin, primidone, rufinamide, topiramate Certain medications that treat or prevent blood clots, such as warfarin Cholestyramine Estrogen and progestin hormones Methotrexate Propofol Rifampin Ritonavir Tolbutamide Zidovudine This list may not describe all possible interactions. Give your health care provider a list of all the medicines, herbs, non-prescription drugs, or dietary supplements you use. Also tell them if you smoke, drink alcohol, or use illegal drugs. Some items may interact with your medicine. What should I watch for while using this medication? Tell your care team if your symptoms do not get better or they start to get worse. This medication may cause serious skin  reactions. They can happen weeks to months after starting the medication. Contact your care team right away if you notice fevers or flu-like symptoms with a rash. The rash may be red or purple and then turn into  blisters or peeling of the skin. Or, you might notice a red rash with swelling of the face, lips or lymph nodes in your neck or under your arms. Wear a medical ID bracelet or chain, and carry a card that describes your disease and details of your medication and dosage times. You may get drowsy, dizzy, or have blurred vision. Do not drive, use machinery, or do anything that needs mental alertness until you know how this medication affects you. To reduce dizzy or fainting spells, do not sit or stand up quickly, especially if you are an older patient. Alcohol can increase drowsiness and dizziness. Avoid alcoholic drinks. This medication can make you more sensitive to the sun. Keep out of the sun. If you cannot avoid being in the sun, wear protective clothing and use sunscreen. Do not use sun lamps or tanning beds/booths. Patients and their families should watch out for new or worsening depression or thoughts of suicide. Also watch out for sudden changes in feelings such as feeling anxious, agitated, panicky, irritable, hostile, aggressive, impulsive, severely restless, overly excited and hyperactive, or not being able to sleep. If this happens, especially at the beginning of treatment or after a change in dose, call your care team. Women should inform their care team if they wish to become pregnant or think they might be pregnant. There is a potential for serious side effects to an unborn child. Talk to your care team or pharmacist for more information. Women who become pregnant while using this medication may enroll in the [place] Antiepileptic Drug Pregnancy Registry by calling [phone]. This registry collects information about the safety of antiepileptic medication use during pregnancy. This medication may cause a decrease in folic acid and vitamin D. You should make sure that you get enough vitamins while you are taking this medication. Discuss the foods you eat and the vitamins you take with  your care team. What side effects may I notice from receiving this medication? Side effects that you should report to your care team as soon as possible: Allergic reactions--skin rash, itching, hives, swelling of the face, lips, tongue, or throat High ammonia level--unusual weakness or fatigue, confusion, loss of appetite, nausea, vomiting, seizures Liver injury--right upper belly pain, loss of appetite, nausea, light-colored stool, dark yellow or brown urine, yellowing skin or eyes, unusual weakness or fatigue Low body temperature, drowsiness, confusion Pancreatitis--severe stomach pain that spreads to your back or gets worse after eating or when touched, fever, nausea, vomiting Rash, fever, and swollen lymph nodes Thoughts of suicide or self-harm, worsening mood, feelings of depression Unusual bruising or bleeding Side effects that usually do not require medical attention (report to your care team if they continue or are bothersome): Change in vision Dizziness Drowsiness Hair loss Headache Nausea Tremors or shaking Weight gain This list may not describe all possible side effects. Call your doctor for medical advice about side effects. You may report side effects to FDA at 1-800-FDA-1088. Where should I keep my medication? Keep out of reach of children and pets. Store at room temperature between 15 and 30 degrees C (59 and 86 degrees F). Keep container tightly closed. Throw away any unused medication after the expiration date. NOTE: This sheet is a summary. It may not cover all possible  information. If you have questions about this medicine, talk to your doctor, pharmacist, or health care provider.  2024 [employer]vier/Gold Standard (2021-04-14 00:00:00)
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
## 2022-10-24 ENCOUNTER — Other Ambulatory Visit
Admission: RE | Admit: 2022-10-24 | Discharge: 2022-10-24 | Disposition: A | Discharge status: Home / Self Care | Payer: Commercial Managed Care - PPO | Attending: Psychiatry | Admitting: Psychiatry

## 2022-10-24 DIAGNOSIS — Z79899 Other long term (current) drug therapy: Secondary | ICD-10-CM | POA: Diagnosis present

## 2022-10-24 DIAGNOSIS — F1021 Alcohol dependence, in remission: Secondary | ICD-10-CM | POA: Diagnosis present

## 2022-10-24 DIAGNOSIS — F1291 Cannabis use, unspecified, in remission: Secondary | ICD-10-CM | POA: Diagnosis present

## 2022-10-24 DIAGNOSIS — F1421 Cocaine dependence, in remission: Secondary | ICD-10-CM | POA: Diagnosis present

## 2022-10-24 DIAGNOSIS — R41840 Attention and concentration deficit: Secondary | ICD-10-CM | POA: Diagnosis present

## 2022-10-24 DIAGNOSIS — F39 Unspecified mood [affective] disorder: Secondary | ICD-10-CM

## 2022-10-24 DIAGNOSIS — F1591 Other stimulant use, unspecified, in remission: Secondary | ICD-10-CM

## 2022-10-24 LAB — HEPATIC FUNCTION PANEL
ALT: 23 U/L (ref 0–44)
AST: 24 U/L (ref 15–41)
Albumin: 3.8 g/dL (ref 3.5–5.0)
Alkaline Phosphatase: 80 U/L (ref 38–126)
Bilirubin, Direct: <0.1 mg/dL (ref 0.0–0.2)
Total Bilirubin: 0.7 mg/dL (ref 0.3–1.2)
Total Protein: 7.2 g/dL (ref 6.5–8.1)

## 2022-10-24 LAB — SODIUM: Sodium: 138 mmol/L (ref 135–145)

## 2022-10-24 LAB — VALPROIC ACID LEVEL: Valproic Acid Lvl: 53 ug/mL (ref 50.0–100.0)

## 2022-10-24 LAB — PLATELET COUNT: Platelets: 217 10*3/uL (ref 150–400)

## 2022-10-25 LAB — URINE DRUGS OF ABUSE SCREEN W ALC, ROUTINE (REF LAB)
Amphetamines, Urine: NEGATIVE ng/mL
Barbiturate, Ur: NEGATIVE ng/mL
Benzodiazepine Quant, Ur: NEGATIVE ng/mL
Cannabinoid Quant, Ur: NEGATIVE ng/mL
Cocaine (Metab.): NEGATIVE ng/mL
Ethanol U, Quan: NEGATIVE %
Methadone Screen, Urine: NEGATIVE ng/mL
Opiate Quant, Ur: NEGATIVE ng/mL
Phencyclidine, Ur: NEGATIVE ng/mL
Propoxyphene, Urine: NEGATIVE ng/mL

## 2022-10-26 ENCOUNTER — Telehealth: Payer: Self-pay | Admitting: Family Medicine

## 2022-10-26 NOTE — Telephone Encounter (Signed)
Copied from CRM [id number]. Topic: General - Other >> Oct 26, 2022 11:13 AM Franchot Heidelberg wrote: Reason for CRM: Pt wants to speak to a nurse regarding questions she has about her levothyroxine, please advise  Best contact:  [phone]

## 2022-10-26 NOTE — Telephone Encounter (Signed)
Patient called to report that her mychart says 50 mg of levothyroxine but she was changed to 75 mg of this recently. Explained its changed in her chart, she just may not be able to see the notes change on her end.  - Emma Phillips

## 2022-11-01 ENCOUNTER — Ambulatory Visit: Payer: Commercial Managed Care - PPO | Admitting: Psychiatry

## 2022-11-01 ENCOUNTER — Encounter: Payer: Self-pay | Admitting: Psychiatry

## 2022-11-01 VITALS — BP 121/85 | HR 81 | Temp 97.9°F | Ht 63.0 in | Wt 216.4 lb

## 2022-11-01 DIAGNOSIS — F1021 Alcohol dependence, in remission: Secondary | ICD-10-CM

## 2022-11-01 DIAGNOSIS — R5383 Other fatigue: Secondary | ICD-10-CM

## 2022-11-01 DIAGNOSIS — R41840 Attention and concentration deficit: Secondary | ICD-10-CM | POA: Diagnosis not present

## 2022-11-01 DIAGNOSIS — F4310 Post-traumatic stress disorder, unspecified: Secondary | ICD-10-CM | POA: Diagnosis not present

## 2022-11-01 DIAGNOSIS — F1591 Other stimulant use, unspecified, in remission: Secondary | ICD-10-CM

## 2022-11-01 DIAGNOSIS — F1421 Cocaine dependence, in remission: Secondary | ICD-10-CM | POA: Diagnosis not present

## 2022-11-01 DIAGNOSIS — Z79899 Other long term (current) drug therapy: Secondary | ICD-10-CM

## 2022-11-01 DIAGNOSIS — F1291 Cannabis use, unspecified, in remission: Secondary | ICD-10-CM

## 2022-11-01 DIAGNOSIS — F39 Unspecified mood [affective] disorder: Secondary | ICD-10-CM

## 2022-11-01 MED ORDER — BUPROPION HCL ER (XL) 150 MG PO TB24
150.0000 mg | ORAL_TABLET | Freq: Every morning | ORAL | 1 refills | Status: DC
Start: 2022-11-01 — End: 2022-12-13

## 2022-11-01 MED ORDER — CITALOPRAM HYDROBROMIDE 10 MG PO TABS
10.0000 mg | ORAL_TABLET | Freq: Every day | ORAL | 0 refills | Status: DC
Start: 2022-11-01 — End: 2022-12-13

## 2022-11-01 NOTE — Progress Notes (Signed)
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  Patient today reports she is currently compliant on the Depakote.  She reports she was able to taper off the Lamictal, did not have any worsening mood symptoms from being tapered off of the Lamictal.  Currently tolerating the Depakote well.  She does not know if she has noticed any good benefits and hence she wants to give it more time.  Patient reports she continues to struggle with a lot of fatigue during the day.  She does feel sleepy and dozes off during the day.  She does report sleep at night okay few nights a week, gets around 6 hours of sleep.  However she continues to feel tired when she wakes up in the morning.  Has never had a sleep study done before.  Agreeable to referral.  Patient is currently on bupropion 300 mg in the morning and does not believe that helps with her fatigue.  She used to be on Celexa previously which may have helped better with her mood symptoms.  She is interested in going back on it.  Patient with attention and concentration deficit was referred for ADHD testing, pending at this time.  Patient reports she currently struggles with lack of motivation, reports spends a lot of her time making snacks, eating and does not get much exercise.  She reports she does take care of her children, picks them up from school and do other chores.  Patient does not know if currently she has any binge eating episodes,  agrees to establish care with a therapist.  Patient also agrees to discuss with primary care provider regarding a referral to a dietitian/nutritionist.  Patient currently denies any suicidality, homicidality or perceptual disturbances.  Patient denies any other concerns today.  Visit Diagnosis:    ICD-10-CM   1. PTSD (post-traumatic stress disorder)  F43.10 buPROPion (WELLBUTRIN XL) 150 MG 24 hr tablet    citalopram (CELEXA) 10 MG tablet    2. Episodic mood disorder (HCC)  F39 citalopram (CELEXA) 10 MG tablet    3. Attention and concentration deficit  R41.840 buPROPion (WELLBUTRIN XL) 150 MG 24 hr tablet    4. High risk medication use  Z79.899     5. Cocaine use disorder, severe, in sustained remission (HCC)  F14.21     6. Alcohol use disorder, moderate, in sustained remission (HCC)  F10.21     7. History of methamphetamine use  F15.91     8. Cannabis use disorder in remission  F12.91     9. Other fatigue  R53.83 Ambulatory referral to Pulmonology      Past Psychiatric History: I have reviewed past psychiatric history from progress note on 10/17/2022.  Past Medical History:  Past Medical History:  Diagnosis Date   ADHD (attention deficit hyperactivity disorder)    Anxiety    Bipolar 1 disorder (HCC)    Insomnia     Past Surgical History:  Procedure Laterality Date   COLPOSCOPY     Per patient age  12   INTRAUTERINE DEVICE (IUD) INSERTION  2018   TIBIA IM NAIL INSERTION Right 09/03/2021   Procedure: INTRAMEDULLARY (IM) NAIL TIBIAL;  Surgeon: Ross Marcus, MD;  Location: [facility] ORS;  Service: Orthopedics;  Laterality: Right;    Family Psychiatric History: I have reviewed family psychiatric history from progress note on 10/17/2022.  Family History:  Family History  Problem Relation Age of Onset   Drug abuse Mother    Alcohol abuse Mother    Bipolar disorder Mother    Cancer Mother    Thyroid disease Mother    Cancer Father    ADD / ADHD Brother    Heart disease  Maternal Grandfather    Cancer Maternal Grandmother    Breast cancer Paternal Grandmother    Cancer Paternal Grandmother     Social History: I have reviewed social history from progress note on 10/17/2022. 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   Drug use: Not Currently    Comment: History of cocaine, alcohol, cannabis abuse currently in remission.  Brief history of methamphetamine abuse.   Sexual activity: Yes    Partners: Male    Birth control/protection: I.U.D.  Other Topics Concern   Not on file  Social History Narrative   Not on file   Social Determinants of Health   Financial Resource Strain: Not on file  Food Insecurity: Not on file  Transportation Needs: Not on file  Physical Activity: Not on file  Stress: Not on file  Social Connections: Not on file    Allergies:  Allergies  Allergen Reactions   Penicillins Anaphylaxis    Metabolic Disorder Labs: Lab Results  Component Value Date   HGBA1C 5.4 02/17/2022   No results found for: "PROLACTIN" Lab Results  Component Value Date   CHOL 160 02/17/2022   TRIG 90 02/17/2022   HDL 41 02/17/2022   LDLCALC 102 (H) 02/17/2022   LDLCALC 102 (H) 02/17/2021   Lab Results  Component Value Date   TSH 2.240 10/07/2022   TSH 5.010 (H) 08/18/2022    Therapeutic Level Labs: No results found for: "LITHIUM" Lab Results  Component Value Date    VALPROATE 53 10/24/2022   No results found for: "CBMZ"  Current Medications: Current Outpatient Medications  Medication Sig Dispense Refill   b complex vitamins capsule Take 1 capsule by mouth daily.     buPROPion (WELLBUTRIN XL) 150 MG 24 hr tablet Take 1 tablet (150 mg total) by mouth in the morning. STOP Bupropion 300 mg 90 tablet 1   citalopram (CELEXA) 10 MG tablet Take 1 tablet (10 mg total) by mouth daily. 90 tablet 0   divalproex (DEPAKOTE ER) 250 MG 24 hr tablet Take 1 tablet (250 mg total) by mouth daily with supper. Take daily with 500 mg . 30 tablet 1  divalproex (DEPAKOTE ER) 500 MG 24 hr tablet Take 1 tablet (500 mg total) by mouth daily with supper. Take along with 250 mg daily 30 tablet 1   levothyroxine (SYNTHROID) 50 MCG tablet Take 1 tablet (50 mcg total) by mouth daily. (Patient taking differently: Take 75 mcg by mouth daily.) 90 tablet 1   prazosin (MINIPRESS) 2 MG capsule Take 2 mg by mouth at bedtime. Dr Rexene Edison     traZODone (DESYREL) 50 MG tablet Take 1 tablet (50 mg total) by mouth at bedtime. 90 tablet 1   vitamin B-12 (CYANOCOBALAMIN) 500 MCG tablet Take 500 mcg by mouth daily.     No current facility-administered medications for this visit.     Musculoskeletal: Strength & Muscle Tone: within normal limits Gait & Station: normal Patient leans: N/A  Psychiatric Specialty Exam: Review of Systems  Constitutional:  Positive for fatigue.  Psychiatric/Behavioral:  Positive for decreased concentration, dysphoric mood and sleep disturbance. The patient is nervous/anxious.     Blood pressure 121/85, pulse 81, temperature 97.9 F (36.6 C), temperature source Skin, height 5\' 3"  (1.6 m), weight 216 lb 6.4 oz (98.2 kg).Body mass index is 38.33 kg/m.  General Appearance: Fairly Groomed  Eye Contact:  Fair  Speech:  Clear and Coherent  Volume:  Normal  Mood:   Mood swings  Affect:  Appropriate  Thought Process:  Goal Directed and Descriptions of Associations: Intact   Orientation:  Full (Time, Place, and Person)  Thought Content: Logical   Suicidal Thoughts:  No  Homicidal Thoughts:  No  Memory:  Immediate;   Fair Recent;   Fair Remote;   Fair  Judgement:  Fair  Insight:  Fair  Psychomotor Activity:  Normal  Concentration:  Concentration: Fair and Attention Span: Fair  Recall:  [employer] of Knowledge: Fair  Language: Fair  Akathisia:  No  Handed:  Right  AIMS (if indicated): not done  Assets:  Communication Skills Desire for Improvement Housing Intimacy Social Support Transportation  ADL's:  Intact  Cognition: WNL  Sleep:  Poor   Screenings: GAD-7    Loss adjuster, chartered Office Visit from 11/01/2022 in [facility] Office Visit from 10/17/2022 in [facility] Office Visit from 10/07/2022 in [facility] at [facility] Office Visit from 08/18/2022 in [facility] at [facility] Office Visit from 07/12/2022 in [facility] Medicine at [facility]  Total GAD-7 Score 15 13 0 7 8      PHQ2-9    Flowsheet Row Office Visit from 11/01/2022 in [facility] Office Visit from 10/17/2022 in [facility] Office Visit from 10/07/2022 in [facility] Medicine at [facility] Office Visit from 08/18/2022 in [facility] at [facility] Office Visit from 07/12/2022 in [facility] & [facility] Medicine at [facility]  PHQ-2 Total Score 4 3 0 0 5  PHQ-9 Total Score 19 20 0 3 14      Flowsheet Row Office Visit from 11/01/2022 in [facility] Office Visit from 10/17/2022 in [facility] ED from 09/10/2021 in [facility] Department at [facility]  C-SSRS  RISK CATEGORY No Risk No Risk No Risk        Assessment and Plan: Elyanna Wallick is a 37 year old  Caucasian female with history of mood disorder, attention and focus deficit, polysubstance use disorder in remission, PTSD, was evaluated in office today for a follow-up appointment.  Patient with continued mood symptoms, sleep problems attention and focus problems will benefit from medication management, referral to therapist for CBT as well as consultation for sleep study, plan as noted below.  Plan  PTSD-unstable Restart Celexa 10 mg p.o. daily.  Patient reports it worked for her in the past. Continue prazosin 2 mg p.o. nightly Trazodone 50 mg at bedtime. Patient advised to establish care with a therapist.  Episodic mood disorder-rule out bipolar disorder type II-unstable Depakote to ER 750 mg p.o. daily with supper Will reduce Wellbutrin XL to 150 mg p.o. daily since Celexa is being added. Discontinue Lamictal-tapered off.  Attention and concentration deficit-rule out ADHD-unstable Patient was referred to [place] attention specialist for testing-pending  Cocaine/alcohol/cannabis/methamphetamine use disorder in remission Will monitor closely.  Fatigue-will refer to pulmonology.  Ambulatory referral to pulmonology placed.  High risk medication use-reviewed and discussed the following labs urine drug screen-dated 10/24/2022-negative as expected,Platelet count-217-within normal limits, sodium-within normal limits, hepatic function panel-within normal limits, Depakote level-53-therapeutic.    Collaboration of Care: Collaboration of Care: Referral or follow-up with counselor/therapist AEB patient encouraged to establish care with therapist as well as to follow up with pulmonology as well as primary care provider as discussed above.  Discussed referral to lifestyle medicine specialist-patient declines.  Patient to talk to primary care provider regarding dietitian  consult.  Patient/Guardian was advised Release of Information must be obtained prior to any record release in order to collaborate their care with an outside provider. Patient/Guardian was advised if they have not already done so to contact the registration department to sign all necessary forms in order for [place] to release information regarding their care.   Consent: Patient/Guardian gives verbal consent for treatment and assignment of benefits for services provided during this visit. Patient/Guardian expressed understanding and agreed to proceed.   Follow-up in clinic in 6 weeks or sooner if needed.  This note was generated in part or whole with voice recognition software. Voice recognition is usually quite accurate but there are transcription errors that can and very often do occur. I apologize for any typographical errors that were not detected and corrected.    Jomarie Longs, MD 11/01/2022, 3:13 PM

## 2022-11-07 ENCOUNTER — Encounter: Payer: Self-pay | Admitting: Gastroenterology

## 2022-11-07 ENCOUNTER — Ambulatory Visit (INDEPENDENT_AMBULATORY_CARE_PROVIDER_SITE_OTHER): Payer: Commercial Managed Care - PPO | Admitting: Gastroenterology

## 2022-11-07 VITALS — BP 134/83 | HR 78 | Temp 98.1°F | Ht 63.0 in | Wt 217.0 lb

## 2022-11-07 DIAGNOSIS — K5909 Other constipation: Secondary | ICD-10-CM | POA: Diagnosis not present

## 2022-11-07 MED ORDER — LINACLOTIDE 290 MCG PO CAPS: 290.0000 ug | ORAL_CAPSULE | Freq: Every day | ORAL | Status: DC

## 2022-11-07 MED ORDER — LINACLOTIDE 145 MCG PO CAPS: 145.0000 ug | ORAL_CAPSULE | Freq: Every day | ORAL | Status: DC

## 2022-11-07 MED ORDER — LINACLOTIDE 72 MCG PO CAPS: 72.0000 ug | ORAL_CAPSULE | Freq: Every day | ORAL | Status: DC

## 2022-11-07 NOTE — Progress Notes (Signed)
Gastroenterology Consultation  Referring Provider:     Duanne Limerick, MD Primary Care Physician:  Duanne Limerick, MD Primary Gastroenterologist:  Dr. Servando Snare     Reason for Consultation:     Chronic constipation        HPI:   Emma Phillips is a 37 y.o. y/o female referred for consultation & management of chronic constipation by Dr. Duanne Limerick, MD. This patient comes in today with a history of chronic constipation.  The patient was seen by surgery for a hernia repair but due to out-of-pocket expenses the patient has deferred this until next year.  The patient has a history of hypothyroidism and is being treated for that.  The patient also has had normal liver enzymes. The patient reports that she has a bowel movement every 2 to 3 weeks and states that when she has the bowel movement she has lower abdominal pain and it comes out as soft stools.  She denies any unexplained weight loss black stools or bloody stools.  She also denies any family history of colon cancer or colon polyps.  The patient denies ever having hard stools when she does finally move her bowels.   Past Medical History:  Diagnosis Date   ADHD (attention deficit hyperactivity disorder)    Anxiety    Bipolar 1 disorder (HCC)    Insomnia     Past Surgical History:  Procedure Laterality Date   COLPOSCOPY     Per patient age 39   INTRAUTERINE DEVICE (IUD) INSERTION  2018   TIBIA IM NAIL INSERTION Right 09/03/2021   Procedure: INTRAMEDULLARY (IM) NAIL TIBIAL;  Surgeon: Ross Marcus, MD;  Location: [facility] ORS;  Service: Orthopedics;  Laterality: Right;    Prior to Admission medications   Medication Sig Start Date End Date Taking? Authorizing Provider  b complex vitamins capsule Take 1 capsule by mouth daily.    [provider]  buPROPion (WELLBUTRIN XL) 150 MG 24 hr tablet Take 1 tablet (150 mg total) by mouth in the morning. STOP Bupropion 300 mg 11/01/22   Jomarie Longs, MD  citalopram (CELEXA)  10 MG tablet Take 1 tablet (10 mg total) by mouth daily. 11/01/22   Jomarie Longs, MD  divalproex (DEPAKOTE ER) 250 MG 24 hr tablet Take 1 tablet (250 mg total) by mouth daily with supper. Take daily with 500 mg . 10/17/22   Jomarie Longs, MD  divalproex (DEPAKOTE ER) 500 MG 24 hr tablet Take 1 tablet (500 mg total) by mouth daily with supper. Take along with 250 mg daily 10/17/22   Jomarie Longs, MD  levothyroxine (SYNTHROID) 50 MCG tablet Take 1 tablet (50 mcg total) by mouth daily. Patient taking differently: Take 75 mcg by mouth daily. 08/18/22   Duanne Limerick, MD  prazosin (MINIPRESS) 2 MG capsule Take 2 mg by mouth at bedtime. Dr Rexene Edison 02/06/21   [provider]  traZODone (DESYREL) 50 MG tablet Take 1 tablet (50 mg total) by mouth at bedtime. 10/07/22   Duanne Limerick, MD  vitamin B-12 (CYANOCOBALAMIN) 500 MCG tablet Take 500 mcg by mouth daily.    [provider]    Family History  Problem Relation Age of Onset   Drug abuse Mother    Alcohol abuse Mother    Bipolar disorder Mother    Cancer Mother    Thyroid disease Mother    Cancer Father    ADD / ADHD Brother    Heart disease Maternal Grandfather  Cancer Maternal Grandmother    Breast cancer Paternal Grandmother    Cancer Paternal Grandmother      Social History   Tobacco Use   Smoking status: Former    Current packs/day: 0.00    Types: Cigarettes    Quit date: 11/19/2016    Years since quitting: 5.9    Passive exposure: Past   Smokeless tobacco: Never  Vaping Use   Vaping status: Never Used  Substance Use Topics   Alcohol use: Yes    Comment: History of alcoholism in the past however currently socially only.   Drug use: Not Currently    Comment: History of cocaine, alcohol, cannabis abuse currently in remission.  Brief history of methamphetamine abuse.    Allergies as of 11/07/2022 - Review Complete 11/01/2022  Allergen Reaction Noted   Penicillins Anaphylaxis 06/14/2019    Review of  Systems:    All systems reviewed and negative except where noted in HPI.   Physical Exam:  There were no vitals taken for this visit. No LMP recorded. (Menstrual status: IUD). General:   Alert,  Well-developed, well-nourished, pleasant and cooperative in NAD Head:  Normocephalic and atraumatic. Eyes:  Sclera clear, no icterus.   Conjunctiva pink. Ears:  Normal auditory acuity. Neck:  Supple; no masses or thyromegaly. Lungs:  Respirations even and unlabored.  Clear throughout to auscultation.   No wheezes, crackles, or rhonchi. No acute distress. Heart:  Regular rate and rhythm; no murmurs, clicks, rubs, or gallops. Abdomen:  Normal bowel sounds.  No bruits.  Soft, non-tender and non-distended without masses, hepatosplenomegaly or hernias noted.  No guarding or rebound tenderness.  Negative Carnett sign.   Rectal:  Deferred.  Pulses:  Normal pulses noted. Extremities:  No clubbing or edema.  No cyanosis. Neurologic:  Alert and oriented x3;  grossly normal neurologically. Skin:  Intact without significant lesions or rashes.  No jaundice. Lymph Nodes:  No significant cervical adenopathy. Psych:  Alert and cooperative. Normal mood and affect.  Imaging Studies: No results found.  Assessment and Plan:   Emma Phillips is a 37 y.o. y/o female who comes in today with a bowel movement on average every 3 weeks.  She states that it is soft stools and diarrhea.  Patient is likely having overflow diarrhea.  The patient will be started on a trial of Linzess and has been given 72, 145 and 290 mcg samples to see which 1 works for her.  She has been told that if these cause her significant diarrhea without improving her symptoms then she may need to try adding Citrucel to her daily routine to bulk up the stools to make it less watery and thereby helping her move her bowels more frequently.  The patient has no worry symptoms such as weight loss black stools bloody stools or a family history of colon  cancer therefore does not need any endoscopic procedures at this time.  In addition the patient has had this same problem for many years.  The patient has been explained the plan and agrees with it.    Midge Minium, MD. Clementeen Graham    Note: This dictation was prepared with Dragon dictation along with smaller phrase technology. Any transcriptional errors that result from this process are unintentional.

## 2022-11-07 NOTE — Patient Instructions (Addendum)
Samples of Linzess , , and given  **Take it on an empty stomach, at least 30 minutes before your first meal of the day**  You will start with Linzess , if this dose is not producing a bowel movement within a few days, increase dose to Linzess .  If Linzess does not produce a normal bowel movement, you will need to increase to Linzess .  If you experience diarrhea when increasing dose, you should decrease back to the previous dose.

## 2022-12-01 ENCOUNTER — Encounter: Payer: Self-pay | Admitting: Internal Medicine

## 2022-12-01 ENCOUNTER — Ambulatory Visit: Payer: Commercial Managed Care - PPO | Admitting: Internal Medicine

## 2022-12-01 VITALS — BP 120/78 | HR 73 | Temp 97.5°F | Ht 63.0 in | Wt 215.4 lb

## 2022-12-01 DIAGNOSIS — G4719 Other hypersomnia: Secondary | ICD-10-CM | POA: Diagnosis not present

## 2022-12-01 NOTE — Patient Instructions (Signed)
Recommend Home sleep test to assess for sleep apnea  Recommend weight loss   Avoid secondhand smoke Avoid SICK contacts Recommend  Masking  when appropriate Recommend Keep up-to-date with vaccinations

## 2022-12-01 NOTE — Progress Notes (Signed)
Name: Emma Phillips MRN: [id number] DOB: 10/20/1985    CHIEF COMPLAINT:  EXCESSIVE DAYTIME SLEEPINESS   HISTORY OF PRESENT ILLNESS: Patient is seen today for problems and issues with sleep related to excessive daytime sleepiness Patient  has been having sleep problems for many years Patient has been having excessive daytime sleepiness for a long time Patient has been having extreme fatigue and tiredness, lack of energy +  very Loud snoring every night + struggling breathe at night and gasps for air + morning headaches + Nonrefreshing sleep   Encouraged proper weight management.  Discussed driving precautions and its relationship with hypersomnolence.  Discussed operating dangerous equipment and its relationship with hypersomnolence.  Discussed sleep hygiene, and benefits of a fixed sleep waked time.  The importance of getting eight or more hours of sleep discussed with patient.  Discussed limiting the use of the computer and television before bedtime.  Decrease naps during the day, so night time sleep will become enhanced.  Limit caffeine, and sleep deprivation.  HTN, stroke, and heart failure are potential risk factors.    EPWORTH SLEEP SCORE 10  Patient current weight 215 pounds height 5 foot 3 inches Highly recommend weight loss Recommend daily exercise  No exacerbation at this time No evidence of heart failure at this time No evidence or signs of infection at this time No respiratory distress No fevers, chills, nausea, vomiting, diarrhea No evidence of lower extremity edema No evidence hemoptysis     PAST MEDICAL HISTORY :   has a past medical history of ADHD (attention deficit hyperactivity disorder), Anxiety, Bipolar 1 disorder (HCC), and Insomnia.  has a past surgical history that includes Intrauterine device (iud) insertion (2018); Colposcopy; and Tibia IM nail insertion (Right, 09/03/2021). Prior to Admission medications   Medication Sig Start Date End  Date Taking? Authorizing Provider  b complex vitamins capsule Take 1 capsule by mouth daily.    [provider]  buPROPion (WELLBUTRIN XL) 150 MG 24 hr tablet Take 1 tablet (150 mg total) by mouth in the morning. STOP Bupropion 300 mg 11/01/22   Jomarie Longs, MD  citalopram (CELEXA) 10 MG tablet Take 1 tablet (10 mg total) by mouth daily. 11/01/22   Jomarie Longs, MD  divalproex (DEPAKOTE ER) 250 MG 24 hr tablet Take 1 tablet (250 mg total) by mouth daily with supper. Take daily with 500 mg . 10/17/22   Jomarie Longs, MD  divalproex (DEPAKOTE ER) 500 MG 24 hr tablet Take 1 tablet (500 mg total) by mouth daily with supper. Take along with 250 mg daily 10/17/22   Jomarie Longs, MD  levothyroxine (SYNTHROID) 50 MCG tablet Take 75 mcg by mouth daily before breakfast.    [provider]  linaclotide (LINZESS) 145 MCG CAPS capsule Take 1 capsule (145 mcg total) by mouth daily before breakfast. 11/07/22   Midge Minium, MD  linaclotide [facility]) 290 MCG CAPS capsule Take 1 capsule (290 mcg total) by mouth daily before breakfast. 11/07/22   Midge Minium, MD  linaclotide [facility]) 72 MCG capsule Take 1 capsule (72 mcg total) by mouth daily before breakfast. 11/07/22   Midge Minium, MD  prazosin (MINIPRESS) 2 MG capsule Take 2 mg by mouth at bedtime. Dr Rexene Edison 02/06/21   [provider]  traZODone (DESYREL) 50 MG tablet Take 1 tablet (50 mg total) by mouth at bedtime. 10/07/22   Duanne Limerick, MD  vitamin B-12 (CYANOCOBALAMIN) 500 MCG tablet Take 500 mcg by mouth daily.    [provider]   Allergies  Allergen Reactions   Penicillins Anaphylaxis    FAMILY HISTORY:  family history includes ADD / ADHD in her brother; Alcohol abuse in her mother; Bipolar disorder in her mother; Breast cancer in her paternal grandmother; Cancer in her father, maternal grandmother, mother, and paternal grandmother; Drug abuse in her mother; Heart disease in her maternal grandfather; Thyroid  disease in her mother. SOCIAL HISTORY:  reports that she quit smoking about 6 years ago. Her smoking use included cigarettes. She has been exposed to tobacco smoke. She has never used smokeless tobacco. She reports current alcohol use. She reports that she does not currently use drugs.   Review of Systems:  Gen:  Denies  fever, sweats, chills weight loss  HEENT: Denies blurred vision, double vision, ear pain, eye pain, hearing loss, nose bleeds, sore throat Cardiac:  No dizziness, chest pain or heaviness, chest tightness,edema, No JVD Resp:   No cough, -sputum production, -shortness of breath,-wheezing, -hemoptysis,  Gi: Denies swallowing difficulty, stomach pain, nausea or vomiting, diarrhea, constipation, bowel incontinence Gu:  Denies bladder incontinence, burning urine Ext:   Denies Joint pain, stiffness or swelling Skin: Denies  skin rash, easy bruising or bleeding or hives Endoc:  Denies polyuria, polydipsia , polyphagia or weight change Psych:   Denies depression, insomnia or hallucinations  Other:  All other systems negative   ALL OTHER ROS ARE NEGATIVE  BP 120/78 (BP Location: Right Arm, Cuff Size: Large)   Pulse 73   Temp (!) 97.5 F (36.4 C)   Ht 5\' 3"  (1.6 m)   Wt 215 lb 6.4 oz (97.7 kg)   SpO2 98%   BMI 38.16 kg/m     Physical Examination:   General Appearance: No distress  EYES PERRLA, EOM intact.   NECK Supple, No JVD Pulmonary: normal breath sounds, No wheezing.  CardiovascularNormal S1,S2.  No m/r/g.   Abdomen: Benign, Soft, non-tender. Skin:   warm, no rashes, no ecchymosis  Extremities: normal, no cyanosis, clubbing. Neuro:without focal findings,  speech normal  PSYCHIATRIC: Mood, affect within normal limits.   ALL OTHER ROS ARE NEGATIVE    ASSESSMENT AND PLAN SYNOPSIS  Patient with signs and symptoms of excessive daytime sleepiness with probable underlying diagnosis of obstructive sleep apnea in the setting of obesity and deconditioned  state   Recommend Sleep Study for definitve diagnosis  Obesity -recommend significant weight loss -recommend changing diet  Deconditioned state -Recommend increased daily activity and exercise   MEDICATION ADJUSTMENTS/LABS AND TESTS ORDERED: Recommend Sleep Study Recommend weight loss   CURRENT MEDICATIONS REVIEWED AT LENGTH WITH PATIENT TODAY   Patient  satisfied with Plan of action and management. All questions answered  Follow up  3 months  Total Time Spent  45 mins   Wallis Bamberg Santiago Glad, M.D.  [facility]  Medical Director [facility] Medical Director [facility] Cardio-Pulmonary Department

## 2022-12-05 ENCOUNTER — Telehealth: Payer: Self-pay

## 2022-12-05 MED ORDER — LINACLOTIDE 145 MCG PO CAPS: 145.0000 ug | ORAL_CAPSULE | Freq: Every day | ORAL | 2 refills | Status: DC

## 2022-12-05 NOTE — Telephone Encounter (Signed)
Patient left a voicemail on main line at 8:45am this morning for a call back. Return patient called and states she left a voicemail on dr. Servando Snare medical assistant voicemail on Wednesday to let him know which Linzess samples work the best and she still has not heard a call back. She states the the Linzess worked the best for her.

## 2022-12-05 NOTE — Telephone Encounter (Signed)
Rx sent through e-scribe  

## 2022-12-10 ENCOUNTER — Other Ambulatory Visit: Payer: Self-pay | Admitting: Psychiatry

## 2022-12-10 DIAGNOSIS — F39 Unspecified mood [affective] disorder: Secondary | ICD-10-CM

## 2022-12-13 ENCOUNTER — Ambulatory Visit (INDEPENDENT_AMBULATORY_CARE_PROVIDER_SITE_OTHER): Payer: Commercial Managed Care - PPO | Admitting: Psychiatry

## 2022-12-13 ENCOUNTER — Encounter: Payer: Self-pay | Admitting: Psychiatry

## 2022-12-13 VITALS — BP 132/82 | HR 74 | Temp 98.8°F | Ht 63.0 in | Wt 212.6 lb

## 2022-12-13 DIAGNOSIS — F4310 Post-traumatic stress disorder, unspecified: Secondary | ICD-10-CM

## 2022-12-13 DIAGNOSIS — F39 Unspecified mood [affective] disorder: Secondary | ICD-10-CM | POA: Diagnosis not present

## 2022-12-13 DIAGNOSIS — Z79899 Other long term (current) drug therapy: Secondary | ICD-10-CM | POA: Diagnosis not present

## 2022-12-13 DIAGNOSIS — F1021 Alcohol dependence, in remission: Secondary | ICD-10-CM

## 2022-12-13 DIAGNOSIS — F1291 Cannabis use, unspecified, in remission: Secondary | ICD-10-CM

## 2022-12-13 DIAGNOSIS — R41840 Attention and concentration deficit: Secondary | ICD-10-CM | POA: Diagnosis not present

## 2022-12-13 DIAGNOSIS — F1591 Other stimulant use, unspecified, in remission: Secondary | ICD-10-CM

## 2022-12-13 DIAGNOSIS — F1421 Cocaine dependence, in remission: Secondary | ICD-10-CM

## 2022-12-13 MED ORDER — BUPROPION HCL 75 MG PO TABS
75.0000 mg | ORAL_TABLET | Freq: Every morning | ORAL | 0 refills | Status: DC
Start: 2022-12-13 — End: 2023-01-27

## 2022-12-13 MED ORDER — DIVALPROEX SODIUM ER 250 MG PO TB24
250.0000 mg | ORAL_TABLET | Freq: Every day | ORAL | 0 refills | Status: DC
Start: 2022-12-13 — End: 2023-04-06

## 2022-12-13 MED ORDER — CITALOPRAM HYDROBROMIDE 20 MG PO TABS: 20.0000 mg | ORAL_TABLET | Freq: Every day | ORAL | 0 refills | Status: DC

## 2022-12-13 MED ORDER — DIVALPROEX SODIUM ER 500 MG PO TB24
500.0000 mg | ORAL_TABLET | Freq: Every day | ORAL | 0 refills | Status: DC
Start: 2022-12-13 — End: 2023-03-14

## 2022-12-13 NOTE — Patient Instructions (Signed)
[email or web address]  [email or web address]  [email or web address] [facility] [address], [place], [place] [address]   [phone]  [facility] [email or web address] [address] [place], [place] [address]  [phone]  [facility] [email or web address] [address], [place], [place] [address]  [phone]   [facility] - [phone]  [facility] - [phone]  [facility] - [phone] counseling [phone]  [facility] [phone]   [email or web address] this clinician can offer telehealth and has a sliding scale option  [email or web address] this group also offers sliding scale rates and is based out of [place]  [employer] [facility] has interns who offer sliding scale rates and some of the full time clinicians do, as well. You complete their contact form on their website and the referrals coordinator will help to get connected to someone   Medicaid below :  [facility] [address] [place], [place] [address]  [phone]    [employer] [address] [place], [place] [address]  [phone]    [facility] [address] [place], [place] [address]  [phone]
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
## 2023-01-05 ENCOUNTER — Telehealth: Payer: Self-pay | Admitting: Psychiatry

## 2023-01-05 DIAGNOSIS — F4310 Post-traumatic stress disorder, unspecified: Secondary | ICD-10-CM

## 2023-01-05 MED ORDER — CITALOPRAM HYDROBROMIDE 10 MG PO TABS
15.0000 mg | ORAL_TABLET | Freq: Every day | ORAL | 1 refills | Status: DC
Start: 2023-01-05 — End: 2023-02-02

## 2023-01-05 NOTE — Telephone Encounter (Signed)
The patient managed with Celexa 20mg , reported experiencing daily headaches, nausea, and dizziness. These symptoms were severe enough to prevent her from eating dinner on one occasion, leading to early bedtime. The patient noted that these adverse effects began after an increase in the Celexa dosage and discontinuation of Wellbutrin. The patient expressed a desire to reduce the Celexa dosage due to the severity of the side effects. She had previously been on a 10mg  dosage of Celexa without these adverse effects. The patient's next scheduled appointment was set for February 16th.  Reduce Celexa to 15 mg by taking one and a half tablets of the 10 mg dose. - If side effects persist, reduce Celexa to 10 mg. - Monitor symptoms for two weeks. - Follow-up appointment on March 05, 2023. - Place on cancellation list for earlier appointment if available.

## 2023-01-27 ENCOUNTER — Ambulatory Visit
Admission: EM | Admit: 2023-01-27 | Discharge: 2023-01-27 | Disposition: A | Discharge status: Home / Self Care | Payer: Commercial Managed Care - PPO | Attending: Emergency Medicine | Admitting: Emergency Medicine

## 2023-01-27 ENCOUNTER — Encounter: Payer: Self-pay | Admitting: Emergency Medicine

## 2023-01-27 DIAGNOSIS — N390 Urinary tract infection, site not specified: Secondary | ICD-10-CM | POA: Insufficient documentation

## 2023-01-27 DIAGNOSIS — B9689 Other specified bacterial agents as the cause of diseases classified elsewhere: Secondary | ICD-10-CM | POA: Diagnosis present

## 2023-01-27 DIAGNOSIS — N760 Acute vaginitis: Secondary | ICD-10-CM | POA: Diagnosis present

## 2023-01-27 LAB — URINALYSIS, W/ REFLEX TO CULTURE (INFECTION SUSPECTED)
Bilirubin Urine: NEGATIVE
Glucose, UA: NEGATIVE mg/dL
Ketones, ur: NEGATIVE mg/dL
Nitrite: NEGATIVE
Protein, ur: NEGATIVE mg/dL
Specific Gravity, Urine: 1.02 (ref 1.005–1.030)
pH: 7.5 (ref 5.0–8.0)

## 2023-01-27 LAB — WET PREP, GENITAL
Sperm: NONE SEEN
Trich, Wet Prep: NONE SEEN
WBC, Wet Prep HPF POC: <10 — AB (ref <10)
Yeast Wet Prep HPF POC: NONE SEEN

## 2023-01-27 MED ORDER — METRONIDAZOLE 500 MG PO TABS: 500.0000 mg | ORAL_TABLET | Freq: Two times a day (BID) | ORAL | 0 refills | Status: DC

## 2023-01-27 MED ORDER — PHENAZOPYRIDINE HCL 200 MG PO TABS: 200.0000 mg | ORAL_TABLET | Freq: Three times a day (TID) | ORAL | 0 refills | Status: DC

## 2023-01-27 MED ORDER — SULFAMETHOXAZOLE-TRIMETHOPRIM 800-160 MG PO TABS: 1.0000 | ORAL_TABLET | Freq: Two times a day (BID) | ORAL | 0 refills | Status: AC

## 2023-01-27 NOTE — ED Triage Notes (Signed)
 Patient c/o dysuria and lower back pain and urinary frequency that started this morning.

## 2023-01-27 NOTE — Discharge Instructions (Signed)
 Take the Flagyl  (metronidazole ) 500 mg twice daily for treatment of your bacterial vaginosis.  Avoid alcohol while on the metronidazole  as taken together will cause of vomiting.  Bacterial vaginosis is often caused by a imbalance of bacteria in your vaginal vault.  This is sometimes a result of using tampons or hormonal fluctuations during her menstrual cycle.  You if your symptoms are recurrent you can try using a boric acid suppository twice weekly to help maintain the acid-base balance in your vagina vault which could prevent further infection.  You can also try vaginal probiotics to help return normal bacterial balance.   Take the Bactrim  DS twice daily for 7 days with food for treatment of urinary tract infection.  Use the Pyridium  every 8 hours as needed for urinary discomfort.  This will turn your urine a bright red-orange.  Increase your oral fluid intake so that you increase your urine production and or flushing your urinary system.  Take an over-the-counter probiotic, such as Culturelle-Align-Activia, 1 hour after each dose of antibiotic to prevent diarrhea or yeast infections from forming.  We will culture urine and change the antibiotics if necessary.  Return for reevaluation, or see your primary care provider, for any new or worsening symptoms.

## 2023-01-27 NOTE — ED Provider Notes (Signed)
 [facility]    CSN: [id number] Arrival date & time: 01/27/23  1629      History   Chief Complaint Chief Complaint  Patient presents with   Dysuria   Back Pain    HPI Shiann Kam is a 38 y.o. female.   HPI  38 year old female with past medical history significant for bipolar type I disorder, ADHD, and anxiety presents for evaluation of UTI symptoms that started this morning.  She is endorsing low back pain with dysuria, urgency, and frequency.  She denies any fever, blood in her urine, nausea, vomiting.  She also denies vaginal discharge or itching.  She is reporting this is the third UTI in 3 weeks.  Her first UTI was treated with a 5-day course of Macrobid, the second was treated with a 3-day course of an unknown antibiotic, and now this is her third 1.  She is attributing her UTIs to having held her urine for an extended period of time.  Past Medical History:  Diagnosis Date   ADHD (attention deficit hyperactivity disorder)    Anxiety    Bipolar 1 disorder (HCC)    Insomnia     Patient Active Problem List   Diagnosis Date Noted   PTSD (post-traumatic stress disorder) 10/17/2022   Episodic mood disorder (HCC) 10/17/2022   High risk medication use 10/17/2022   Attention and concentration deficit 10/17/2022   Cocaine use disorder, severe, in sustained remission (HCC) 10/17/2022   Alcohol use disorder, moderate, in sustained remission (HCC) 10/17/2022   History of methamphetamine use 10/17/2022   Cannabis use disorder in remission 10/17/2022   Displaced spiral fracture of shaft of right tibia, initial encounter for closed fracture 09/02/2021   IUD (intrauterine device) in place 03/02/2020   Bipolar 2 disorder, major depressive episode (HCC) 11/24/2013    Past Surgical History:  Procedure Laterality Date   COLPOSCOPY     Per patient age 9   INTRAUTERINE DEVICE (IUD) INSERTION  2018   TIBIA IM NAIL INSERTION Right 09/03/2021   Procedure:  INTRAMEDULLARY (IM) NAIL TIBIAL;  Surgeon: Rollene Cough, MD;  Location: [facility] ORS;  Service: Orthopedics;  Laterality: Right;    OB History     Gravida  6   Para  3   Term  3   Preterm      AB  3   Living  2      SAB  2   IAB  1   Ectopic      Multiple      Live Births  2            Home Medications    Prior to Admission medications   Medication Sig Start Date End Date Taking? Authorizing Provider  citalopram  (CELEXA ) 10 MG tablet Take 1.5 tablets (15 mg total) by mouth daily. 01/05/23   Eappen, Saramma, MD  metroNIDAZOLE  (FLAGYL ) 500 MG tablet Take 1 tablet (500 mg total) by mouth 2 (two) times daily. 01/27/23  Yes Bernardino Ditch, NP  phenazopyridine  (PYRIDIUM ) 200 MG tablet Take 1 tablet (200 mg total) by mouth 3 (three) times daily. 01/27/23  Yes Bernardino Ditch, NP  sulfamethoxazole -trimethoprim  (BACTRIM  DS) 800-160 MG tablet Take 1 tablet by mouth 2 (two) times daily for 7 days. 01/27/23 02/03/23 Yes Bernardino Ditch, NP  amphetamine-dextroamphetamine (ADDERALL XR) 20 MG 24 hr capsule Take 20 mg by mouth daily. 11/18/22   [provider]  b complex vitamins capsule Take 1 capsule by mouth daily.    [provider]  divalproex  (DEPAKOTE  ER) 250 MG 24 hr tablet Take 1 tablet (250 mg total) by mouth daily with supper. Take daily with 500 mg . 12/13/22   Eappen, Saramma, MD  divalproex  (DEPAKOTE  ER) 500 MG 24 hr tablet Take 1 tablet (500 mg total) by mouth daily with supper. Take along with 250 mg daily 12/13/22   Eappen, Saramma, MD  levothyroxine  (SYNTHROID ) 50 MCG tablet Take 75 mcg by mouth daily before breakfast.    [provider]  linaclotide  (LINZESS ) 145 MCG CAPS capsule Take 1 capsule (145 mcg total) by mouth daily before breakfast. 12/05/22   Jinny Carmine, MD  prazosin  (MINIPRESS ) 2 MG capsule Take 2 mg by mouth at bedtime. Dr VEAR 02/06/21   [provider]  traZODone  (DESYREL ) 50 MG tablet Take 1 tablet (50 mg total) by mouth at  bedtime. 10/07/22   Joshua Cathryne BROCKS, MD  vitamin B-12 (CYANOCOBALAMIN) 500 MCG tablet Take 500 mcg by mouth daily.    [provider]    Family History Family History  Problem Relation Age of Onset   Drug abuse Mother    Alcohol abuse Mother    Bipolar disorder Mother    Cancer Mother    Thyroid  disease Mother    Cancer Father    ADD / ADHD Brother    Heart disease Maternal Grandfather    Cancer Maternal Grandmother    Breast cancer Paternal Grandmother    Cancer Paternal Grandmother     Social History Social History   Tobacco Use   Smoking status: Former    Current packs/day: 0.00    Types: Cigarettes    Quit date: 11/19/2016    Years since quitting: 6.1    Passive exposure: Past   Smokeless tobacco: Never  Vaping Use   Vaping status: Never Used  Substance Use Topics   Alcohol use: Yes    Comment: History of alcoholism in the past however currently socially only.   Drug use: Not Currently    Comment: History of cocaine, alcohol, cannabis abuse currently in remission.  Brief history of methamphetamine abuse.     Allergies   Penicillins   Review of Systems Review of Systems  Constitutional:  Negative for fever.  Gastrointestinal:  Negative for abdominal pain, nausea and vomiting.  Genitourinary:  Positive for dysuria, frequency, urgency and vaginal pain. Negative for hematuria and vaginal discharge.  Musculoskeletal:  Positive for back pain.     Physical Exam Triage Vital Signs ED Triage Vitals [01/27/23 1640]  Encounter Vitals Group     BP      Systolic BP Percentile      Diastolic BP Percentile      Pulse      Resp      Temp      Temp src      SpO2      Weight 212 lb 8.4 oz (96.4 kg)     Height 5' 3 (1.6 m)     Head Circumference      Peak Flow      Pain Score 7     Pain Loc      Pain Education      Exclude from Growth Chart    No data found.  Updated Vital Signs BP 135/85 (BP Location: Left Arm)   Pulse 60   Temp 98.4 F (36.9  C) (Oral)   Resp 14   Ht 5' 3 (1.6 m)   Wt 212 lb 8.4 oz (96.4  kg)   SpO2 96%   BMI 37.65 kg/m   Visual Acuity Right Eye Distance:   Left Eye Distance:   Bilateral Distance:    Right Eye Near:   Left Eye Near:    Bilateral Near:     Physical Exam Vitals and nursing note reviewed.  Constitutional:      Appearance: Normal appearance. She is not ill-appearing.  HENT:     Head: Normocephalic and atraumatic.  Cardiovascular:     Rate and Rhythm: Normal rate and regular rhythm.     Pulses: Normal pulses.     Heart sounds: Normal heart sounds. No murmur heard.    No friction rub. No gallop.  Pulmonary:     Effort: Pulmonary effort is normal.     Breath sounds: Normal breath sounds. No wheezing, rhonchi or rales.  Abdominal:     Tenderness: There is no right CVA tenderness or left CVA tenderness.  Skin:    General: Skin is warm and dry.     Capillary Refill: Capillary refill takes less than 2 seconds.     Findings: No rash.  Neurological:     General: No focal deficit present.     Mental Status: She is alert and oriented to person, place, and time.      UC Treatments / Results  Labs (all labs ordered are listed, but only abnormal results are displayed) Labs Reviewed  WET PREP, GENITAL - Abnormal; Notable for the following components:      Result Value   Clue Cells Wet Prep HPF POC PRESENT (*)    WBC, Wet Prep HPF POC <10 (*)    All other components within normal limits  URINALYSIS, W/ REFLEX TO CULTURE (INFECTION SUSPECTED) - Abnormal; Notable for the following components:   APPearance HAZY (*)    Hgb urine dipstick MODERATE (*)    Leukocytes,Ua TRACE (*)    Bacteria, UA MANY (*)    All other components within normal limits  URINE CULTURE    EKG   Radiology No results found.  Procedures Procedures (including critical care time)  Medications Ordered in UC Medications - No data to display  Initial Impression / Assessment and Plan / UC Course  I have  reviewed the triage vital signs and the nursing notes.  Pertinent labs & imaging results that were available during my care of the patient were reviewed by me and considered in my medical decision making (see chart for details).   Patient is a nontoxic-appearing 38 year old female presenting for evaluation of UTI symptoms as outlined HPI above.  As mentioned above, the patient is reporting that this is her third UTI in 3 weeks.  The first UTI was treated with a course of Macrobid, the second UTI was treated with a 3-day course of an unknown antibiotic, and this is her third UTI.  She reports that each round of antibiotics seems to have resolved the UTI but then after holding her urine her UTI returned.  She reports that she had to hold her urine for a 4-hour period yesterday and is attributing that to the urine symptoms she is experiencing.  Patient is endorsing some burning in her vaginal area but denies any discharge or itching.  I will order urinalysis to evaluate for the presence of UTI.  I am also suspicious that patient may have a yeast infection given that she has been on 2 previous rounds of antibiotics for UTIs.  No records are available in epic for the previous  2 UTIs.  Urinalysis shows hazy appearance with moderate hemoglobin and trace leukocyte esterase.  Negative for nitrites, protein, or glucose.  Reflex microscopy shows skin contamination with 6-10 squamous epithelials, 6-10 WBCs, and 6-10 RBCs.  Many bacteria present.  I will order a vaginal wet prep.  Vaginal wet prep is positive for clue cells.  I will discharge patient home with a diagnosis of UTI and also bacterial vaginosis.  I will treat the UTI with Bactrim  DS twice daily for 7 days and the bacterial vaginosis with metronidazole  500 mg twice daily for 7 days.  I will also send over prescription for Pyridium  to help with urinary discomfort.  Additionally, I will order a urine culture but have patient recollected due to skin  contamination.   Final Clinical Impressions(s) / UC Diagnoses   Final diagnoses:  Lower urinary tract infectious disease  BV (bacterial vaginosis)     Discharge Instructions      Take the Flagyl  (metronidazole ) 500 mg twice daily for treatment of your bacterial vaginosis.  Avoid alcohol while on the metronidazole  as taken together will cause of vomiting.  Bacterial vaginosis is often caused by a imbalance of bacteria in your vaginal vault.  This is sometimes a result of using tampons or hormonal fluctuations during her menstrual cycle.  You if your symptoms are recurrent you can try using a boric acid suppository twice weekly to help maintain the acid-base balance in your vagina vault which could prevent further infection.  You can also try vaginal probiotics to help return normal bacterial balance.   Take the Bactrim  DS twice daily for 7 days with food for treatment of urinary tract infection.  Use the Pyridium  every 8 hours as needed for urinary discomfort.  This will turn your urine a bright red-orange.  Increase your oral fluid intake so that you increase your urine production and or flushing your urinary system.  Take an over-the-counter probiotic, such as Culturelle-Align-Activia, 1 hour after each dose of antibiotic to prevent diarrhea or yeast infections from forming.  We will culture urine and change the antibiotics if necessary.  Return for reevaluation, or see your primary care provider, for any new or worsening symptoms.      ED Prescriptions     Medication Sig Dispense Auth. Provider   sulfamethoxazole -trimethoprim  (BACTRIM  DS) 800-160 MG tablet Take 1 tablet by mouth 2 (two) times daily for 7 days. 14 tablet Bernardino Ditch, NP   metroNIDAZOLE  (FLAGYL ) 500 MG tablet Take 1 tablet (500 mg total) by mouth 2 (two) times daily. 14 tablet Bernardino Ditch, NP   phenazopyridine  (PYRIDIUM ) 200 MG tablet Take 1 tablet (200 mg total) by mouth 3 (three) times daily. 6 tablet  Bernardino Ditch, NP      PDMP not reviewed this encounter.   Bernardino Ditch, NP 01/27/23 1721

## 2023-01-28 LAB — URINE CULTURE
Culture: NO GROWTH
Special Requests: NORMAL

## 2023-01-31 ENCOUNTER — Other Ambulatory Visit: Payer: Self-pay | Admitting: Psychiatry

## 2023-01-31 DIAGNOSIS — F4310 Post-traumatic stress disorder, unspecified: Secondary | ICD-10-CM

## 2023-02-02 ENCOUNTER — Ambulatory Visit (INDEPENDENT_AMBULATORY_CARE_PROVIDER_SITE_OTHER): Payer: Commercial Managed Care - PPO | Admitting: Psychiatry

## 2023-02-02 ENCOUNTER — Encounter: Payer: Self-pay | Admitting: Psychiatry

## 2023-02-02 VITALS — BP 122/76 | HR 110 | Ht 63.0 in | Wt 211.0 lb

## 2023-02-02 DIAGNOSIS — R41840 Attention and concentration deficit: Secondary | ICD-10-CM

## 2023-02-02 DIAGNOSIS — F1421 Cocaine dependence, in remission: Secondary | ICD-10-CM | POA: Diagnosis not present

## 2023-02-02 DIAGNOSIS — F1591 Other stimulant use, unspecified, in remission: Secondary | ICD-10-CM

## 2023-02-02 DIAGNOSIS — F1021 Alcohol dependence, in remission: Secondary | ICD-10-CM

## 2023-02-02 DIAGNOSIS — F1291 Cannabis use, unspecified, in remission: Secondary | ICD-10-CM

## 2023-02-02 DIAGNOSIS — F4310 Post-traumatic stress disorder, unspecified: Secondary | ICD-10-CM | POA: Diagnosis not present

## 2023-02-02 DIAGNOSIS — F39 Unspecified mood [affective] disorder: Secondary | ICD-10-CM | POA: Diagnosis not present

## 2023-02-02 MED ORDER — PRAZOSIN HCL 2 MG PO CAPS
4.0000 mg | ORAL_CAPSULE | Freq: Every day | ORAL | 1 refills | Status: DC
Start: 2023-02-02 — End: 2023-02-27

## 2023-02-02 NOTE — Progress Notes (Signed)
[facility] MD OP Progress Note  02/02/2023 9:36 AM Emma Phillips  MRN:  [id number]  Chief Complaint:  Chief Complaint  Patient presents with   Follow-up   Anxiety   Post-Traumatic Stress Disorder   HPI: Emma Phillips is a 38 year old Caucasian female, married, stay-at-home mom, lives in [place], has a history of PTSD, episodic mood disorder, attention and concentration deficit, cocaine, alcohol, methamphetamine, cannabis use disorder in remission was evaluated in office today.  The patient presents with concerns about intermittent episodes of intense irritability, cold sweats, and a sensation of internal 'raging.' These episodes, occurring two to three times a week, have been ongoing for several years and are often triggered by household stressors. The patient describes a tingling sensation in her arm during these episodes, which she has learned to manage by isolating herself until the episode passes.  The patient also reports a recent history of recurrent urinary tract infections (UTIs) and bacterial vaginosis, for which she is currently on antibiotics.   She has been experiencing nightmares, despite being on prazosin, and expresses dissatisfaction with the effectiveness of this medication.  The patient is also on Celexa, which was recently reduced due to side effects.she is tolerating the lower dosage well.  The patient has not yet established care with a therapist, despite previous discussions about the potential benefits of therapy for managing her PTSD symptoms.  In addition to her mental health concerns, the patient recently experienced the loss of a pet, which has been emotionally challenging. She has since adopted a new puppy, which has been a source of both joy and stress.   The patient is due to see her ADHD specialist soon and is also considering reaching out to an OBGYN to discuss potential hormonal changes that could be contributing to her symptoms. She expresses a desire to better  understand and manage her intermittent episodes of intense irritability and internal 'raging.'  Patient denies any suicidality, homicidality or perceptual disturbances.    Visit Diagnosis:    ICD-10-CM   1. PTSD (post-traumatic stress disorder)  F43.10 prazosin (MINIPRESS) 2 MG capsule    2. Episodic mood disorder (HCC)  F39    Rule out bipolar type 2    3. Attention and concentration deficit  R41.840     4. Cocaine use disorder, severe, in sustained remission (HCC)  F14.21     5. Alcohol use disorder, moderate, in sustained remission (HCC)  F10.21     6. History of methamphetamine use  F15.91     7. Cannabis use disorder in remission  F12.91       Past Psychiatric History: I have reviewed past psychiatric history from progress note on 10/17/2022.  Past Medical History:  Past Medical History:  Diagnosis Date   ADHD (attention deficit hyperactivity disorder)    Anxiety    Bipolar 1 disorder (HCC)    Insomnia     Past Surgical History:  Procedure Laterality Date   COLPOSCOPY     Per patient age 16   INTRAUTERINE DEVICE (IUD) INSERTION  2018   TIBIA IM NAIL INSERTION Right 09/03/2021   Procedure: INTRAMEDULLARY (IM) NAIL TIBIAL;  Surgeon: Ross Marcus, MD;  Location: [facility];  Service: Orthopedics;  Laterality: Right;    Family Psychiatric History: I have reviewed family psychiatric history from progress note on 10/17/2022.  Family History:  Family History  Problem Relation Age of Onset   Drug abuse Mother    Alcohol abuse Mother    Bipolar disorder Mother  Cancer Mother    Thyroid disease Mother    Cancer Father    ADD / ADHD Brother    Heart disease Maternal Grandfather    Cancer Maternal Grandmother    Breast cancer Paternal Grandmother    Cancer Paternal Grandmother     Social History: I have reviewed social history from progress note on 10/17/2022. 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   Drug use: Not Currently    Comment: History of cocaine, alcohol, cannabis abuse currently in remission.  Brief history of methamphetamine abuse.   Sexual activity: Yes    Partners: Male    Birth control/protection: I.U.D.  Other Topics Concern   Not on file  Social History Narrative   Not on file   Social Drivers of Health   Financial Resource Strain: Not on file  Food Insecurity: Not on file  Transportation Needs: Not on file  Physical Activity: Not on file  Stress: Not on file  Social Connections: Not on file    Allergies:  Allergies  Allergen Reactions   Penicillins Anaphylaxis    Metabolic Disorder Labs: Lab Results  Component Value Date   HGBA1C 5.4 02/17/2022   No results found for: "PROLACTIN" Lab Results  Component Value Date   CHOL 160 02/17/2022   TRIG 90 02/17/2022   HDL 41 02/17/2022   LDLCALC 102 (H) 02/17/2022   LDLCALC 102 (H) 02/17/2021   Lab Results  Component Value Date   TSH 2.240 10/07/2022   TSH 5.010 (H) 08/18/2022    Therapeutic Level Labs: No results found for: "LITHIUM" Lab Results  Component Value Date   VALPROATE 53 10/24/2022   No results found for: "CBMZ"  Current Medications: Current Outpatient Medications  Medication Sig Dispense Refill   amphetamine-dextroamphetamine (ADDERALL XR) 20 MG 24 hr capsule Take 20 mg by mouth daily.     b complex vitamins capsule Take 1 capsule by mouth daily.     divalproex  (DEPAKOTE ER) 250 MG 24 hr tablet Take 1 tablet (250 mg total) by mouth daily with supper. Take daily with 500 mg . 90 tablet 0   divalproex (DEPAKOTE ER) 500 MG 24 hr tablet Take 1 tablet (500 mg total) by mouth daily with supper. Take along with 250 mg daily 90 tablet 0   levothyroxine (SYNTHROID) 50 MCG tablet Take 75 mcg by mouth daily before breakfast.     linaclotide (LINZESS) 145 MCG CAPS capsule Take 1 capsule (145 mcg total) by mouth daily before breakfast. 90 capsule 2   metroNIDAZOLE (FLAGYL) 500 MG tablet Take 1 tablet (500 mg total) by mouth 2 (two) times daily. 14 tablet 0   phenazopyridine (PYRIDIUM) 200 MG tablet Take 1 tablet (200 mg total) by mouth 3 (three) times daily. 6 tablet 0   sulfamethoxazole-trimethoprim (BACTRIM DS) 800-160  MG tablet Take 1 tablet by mouth 2 (two) times daily for 7 days. 14 tablet 0   traZODone (DESYREL) 50 MG tablet Take 1 tablet (50 mg total) by mouth at bedtime. 90 tablet 1   vitamin B-12 (CYANOCOBALAMIN) 500 MCG tablet Take 500 mcg by mouth daily.     citalopram (CELEXA) 10 MG tablet TAKE 1 AND 1/2 TABLETS DAILY BY MOUTH 135 tablet 1   prazosin (MINIPRESS) 2 MG capsule Take 2 capsules (4 mg total) by mouth at bedtime. Dr H 60 capsule 1   No current facility-administered medications for this visit.     Musculoskeletal: Strength & Muscle Tone: within normal limits Gait & Station: normal Patient leans: N/A  Psychiatric Specialty Exam: Review of Systems  Psychiatric/Behavioral:  Positive for sleep disturbance. The patient is nervous/anxious.     Blood pressure 122/76, pulse (!) 110, height 5\' 3"  (1.6 m), weight 211 lb (95.7 kg), last menstrual period 01/19/2023, SpO2 96%.Body mass index is 37.38 kg/m.  General Appearance: Fairly Groomed  Eye Contact:  Fair  Speech:  Clear and Coherent  Volume:  Normal  Mood:  Anxious  Affect:  Appropriate  Thought Process:  Goal Directed and Descriptions of Associations: Intact  Orientation:  Full (Time,  Place, and Person)  Thought Content: Logical   Suicidal Thoughts:  No  Homicidal Thoughts:  No  Memory:  Immediate;   Fair Recent;   Fair Remote;   Fair  Judgement:  Fair  Insight:  Fair  Psychomotor Activity:  Normal  Concentration:  Concentration: Fair and Attention Span: Fair  Recall:  [employer] of Knowledge: Fair  Language: Fair  Akathisia:  No  Handed:  Right  AIMS (if indicated): not done  Assets:  Financial Resources/Insurance Housing Social Support Talents/Skills Transportation  ADL's:  Intact  Cognition: WNL  Sleep:  Poor, has nightmares   Screenings: GAD-7    Loss adjuster, chartered Office Visit from 02/02/2023 in [facility] Office Visit from 12/13/2022 in [facility] Office Visit from 11/01/2022 in [facility] Office Visit from 10/17/2022 in [facility] Office Visit from 10/07/2022 in [facility]  Total GAD-7 Score 4 11 15 13  0      PHQ2-9    Flowsheet Row Office Visit from 02/02/2023 in [facility] Office Visit from 12/13/2022 in [facility] Office Visit from 11/01/2022 in [facility] Office Visit from 10/17/2022 in [facility] Office Visit from 10/07/2022 in [facility] at [facility]  PHQ-2 Total Score 2 2 4 3  0  PHQ-9 Total Score 7 10 19 20  0      Flowsheet Row Office Visit from 02/02/2023 in [facility] ED from 01/27/2023 in [facility] Visit from 12/13/2022 in [facility]  C-SSRS RISK CATEGORY No Risk No Risk No Risk        Assessment and Plan:  Leonna Caird is a 38 year old Caucasian female with history of PTSD, episodic mood disorder, ADHD, history of polysubstance abuse in remission was evaluated in office today.  Patient overall making progress except for episodic anxiety attacks as well as sleep issues due to nightmares related to her past history of trauma, discussed assessment and plan  as noted below.  Post-Traumatic Stress Disorder (PTSD)-unstable Ongoing PTSD symptoms. Currently on Celexa 15 mg, prazosin 2 mg, trazodone 50 mg at bedtime, and Depakote ER 750 mg daily. Celexa dose reduced due to side effects. Reports anxiety attacks with cold sweats, irritability, and tingling in the arm 2-3 times a week. Symptoms predate Adderall use. Emphasized importance of therapy. Risks of increased prazosin include dizziness and falls. Advised to take prazosin at bedtime and avoid walking after. - Increase prazosin to 4 mg at bedtime - Establish care with a therapist - Follow up in 8 weeks, next appointment can be by video  Attention-Deficit/Hyperactivity Disorder (ADHD)-improving Ongoing ADHD managed by a specialist. Currently taking Adderall 20 mg. No reported issues with current regimen. - Continue current ADHD management with specialist - Follow up with ADHD specialist next Thursday  Episodic mood symptoms-improving Mood symptoms improving on the Depakote other than episodic anxiety attacks/irritability.  Unknown if this has physiological etiology or not.  Patient to get repeat TSH done and also discussed with OB/GYN. -Continue Depakote ER 750 mg daily -Depakote level-10/24/2018 24-53-therapeutic.  Polysubstance abuse in remission per history-will continue to monitor closely.    Follow-up - Send prazosin prescription to [facility] in [place] - Check medication refills and send a message through MyChart if needed - Schedule follow-up in 8 weeks, next appointment can be by video   Collaboration of Care: Collaboration of Care: Referral  or follow-up with counselor/therapist AEB patient encouraged to establish care with therapist.  Patient/Guardian was advised Release of Information must be obtained prior to any record release in order to collaborate their care with an outside provider. Patient/Guardian was advised if they have not already done so to contact the registration department to sign all necessary forms in order for [place] to release information regarding their care.   Consent: Patient/Guardian gives verbal consent for treatment and assignment of benefits for services provided during this visit. Patient/Guardian expressed understanding and agreed to proceed.  This note was generated in part or whole with voice recognition software. Voice recognition is usually quite accurate but there are transcription errors that can and very often do occur. I apologize for any typographical errors that were not detected and corrected.     Jomarie Longs, MD 02/03/2023, 9:00 AM

## 2023-02-11 ENCOUNTER — Other Ambulatory Visit: Payer: Self-pay | Admitting: Family Medicine

## 2023-02-11 DIAGNOSIS — E039 Hypothyroidism, unspecified: Secondary | ICD-10-CM

## 2023-02-13 ENCOUNTER — Telehealth: Payer: Self-pay | Admitting: Psychiatry

## 2023-02-13 DIAGNOSIS — F902 Attention-deficit hyperactivity disorder, combined type: Secondary | ICD-10-CM | POA: Insufficient documentation

## 2023-02-13 NOTE — Telephone Encounter (Signed)
I have received medical records from [facility]. Date of evaluation 11/18/2022, 12/13/2022, 02/09/2023  Patient had Qb test completed.   Patient diagnosed with ADHD combined type.  Patient on Adderall extended release 25 mg daily.

## 2023-02-24 ENCOUNTER — Ambulatory Visit: Payer: Commercial Managed Care - PPO | Admitting: Family Medicine

## 2023-02-24 ENCOUNTER — Encounter: Payer: Self-pay | Admitting: Family Medicine

## 2023-02-24 VITALS — BP 110/70 | HR 79 | Ht 63.0 in | Wt 208.4 lb

## 2023-02-24 DIAGNOSIS — F5101 Primary insomnia: Secondary | ICD-10-CM

## 2023-02-24 DIAGNOSIS — E039 Hypothyroidism, unspecified: Secondary | ICD-10-CM | POA: Diagnosis not present

## 2023-02-24 MED ORDER — LEVOTHYROXINE SODIUM 50 MCG PO TABS
75.0000 ug | ORAL_TABLET | Freq: Every day | ORAL | 1 refills | Status: DC
Start: 2023-02-24 — End: 2023-07-24

## 2023-02-24 MED ORDER — TRAZODONE HCL 50 MG PO TABS
50.0000 mg | ORAL_TABLET | Freq: Every day | ORAL | 1 refills | Status: DC
Start: 2023-02-24 — End: 2023-07-24

## 2023-02-24 NOTE — Progress Notes (Signed)
 Date:  02/24/2023   Name:  Emma Phillips   DOB:  07-28-1985   MRN:  [id number]   Chief Complaint: Hypothyroidism (Patient present today for a follow up on her hyperthyroidism. She has been taking her medications as directed. She has been feeling well. )  Thyroid  Problem Presents for follow-up visit. Symptoms include heat intolerance. Patient reports no anxiety, cold intolerance, constipation, depressed mood, diaphoresis, diarrhea, dry skin, fatigue, hair loss, hoarse voice, leg swelling, menstrual problem, nail problem, palpitations, tremors, visual change, weight gain or weight loss. The symptoms have been stable.  Insomnia Primary symptoms: no difficulty falling asleep, no premature morning awakening, no napping.   The onset quality is gradual. The problem has been gradually improving since onset. The treatment provided moderate relief.    Lab Results  Component Value Date   NA 138 10/24/2022   K 4.5 08/18/2022   CO2 21 08/18/2022   GLUCOSE 109 (H) 08/18/2022   BUN 13 08/18/2022   CREATININE 0.71 08/18/2022   CALCIUM 9.4 08/18/2022   EGFR 112 08/18/2022   GFRNONAA >60 09/04/2021   Lab Results  Component Value Date   CHOL 160 02/17/2022   HDL 41 02/17/2022   LDLCALC 102 (H) 02/17/2022   TRIG 90 02/17/2022   Lab Results  Component Value Date   TSH 2.240 10/07/2022   Lab Results  Component Value Date   HGBA1C 5.4 02/17/2022   Lab Results  Component Value Date   WBC 9.1 09/04/2021   HGB 12.3 09/04/2021   HCT 36.3 09/04/2021   MCV 91.2 09/04/2021   PLT 217 10/24/2022   Lab Results  Component Value Date   ALT 23 10/24/2022   AST 24 10/24/2022   ALKPHOS 80 10/24/2022   BILITOT 0.7 10/24/2022   No results found for: 25OHVITD2, 25OHVITD3, VD25OH   Review of Systems  Constitutional:  Negative for diaphoresis, fatigue, weight gain and weight loss.  HENT:  Negative for hoarse voice and trouble swallowing.   Eyes:  Negative for visual disturbance.   Respiratory:  Negative for chest tightness, shortness of breath and wheezing.   Cardiovascular:  Negative for chest pain, palpitations and leg swelling.  Gastrointestinal:  Negative for constipation and diarrhea.  Endocrine: Positive for heat intolerance. Negative for cold intolerance.  Genitourinary:  Negative for difficulty urinating and menstrual problem.  Neurological:  Negative for tremors.  Psychiatric/Behavioral:  The patient has insomnia. The patient is not nervous/anxious.     Patient Active Problem List   Diagnosis Date Noted   ADHD (attention deficit hyperactivity disorder), combined type 02/13/2023   PTSD (post-traumatic stress disorder) 10/17/2022   Episodic mood disorder (HCC) 10/17/2022   High risk medication use 10/17/2022   Attention and concentration deficit 10/17/2022   Cocaine use disorder, severe, in sustained remission (HCC) 10/17/2022   Alcohol use disorder, moderate, in sustained remission (HCC) 10/17/2022   History of methamphetamine use 10/17/2022   Cannabis use disorder in remission 10/17/2022   Displaced spiral fracture of shaft of right tibia, initial encounter for closed fracture 09/02/2021   IUD (intrauterine device) in place 03/02/2020   Bipolar 2 disorder, major depressive episode (HCC) 11/24/2013    Allergies  Allergen Reactions   Penicillins Anaphylaxis    Past Surgical History:  Procedure Laterality Date   COLPOSCOPY     Per patient age 41   INTRAUTERINE DEVICE (IUD) INSERTION  2018   TIBIA IM NAIL INSERTION Right 09/03/2021   Procedure: INTRAMEDULLARY (IM) NAIL TIBIAL;  Surgeon: Rollene Cough,  MD;  Location: [facility] OR[facility];  Service: Orthopedics;  Laterality: Right;    Social History   Tobacco Use   Smoking status: Former    Current packs/day: 0.00    Types: Cigarettes    Quit date: 11/19/2016    Years since quitting: 6.2    Passive exposure: Past   Smokeless tobacco: Never  Vaping Use   Vaping status: Never Used  Substance Use  Topics   Alcohol use: Yes    Comment: History of alcoholism in the past however currently socially only.   Drug use: Not Currently    Comment: History of cocaine, alcohol, cannabis abuse currently in remission.  Brief history of methamphetamine abuse.     Medication list has been reviewed and updated.  Current Meds  Medication Sig   amphetamine-dextroamphetamine (ADDERALL XR) 20 MG 24 hr capsule Take 20 mg by mouth daily.   b complex vitamins capsule Take 1 capsule by mouth daily.   citalopram  (CELEXA ) 10 MG tablet TAKE 1 AND 1/2 TABLETS DAILY BY MOUTH   divalproex  (DEPAKOTE  ER) 250 MG 24 hr tablet Take 1 tablet (250 mg total) by mouth daily with supper. Take daily with 500 mg .   divalproex  (DEPAKOTE  ER) 500 MG 24 hr tablet Take 1 tablet (500 mg total) by mouth daily with supper. Take along with 250 mg daily   levothyroxine  (SYNTHROID ) 50 MCG tablet TAKE 1 TABLET BY MOUTH EVERY DAY (Patient taking differently: Take 75 mcg by mouth daily.)   linaclotide  (LINZESS ) 145 MCG CAPS capsule Take 1 capsule (145 mcg total) by mouth daily before breakfast.   prazosin  (MINIPRESS ) 2 MG capsule Take 2 capsules (4 mg total) by mouth at bedtime. Dr VEAR   traZODone  (DESYREL ) 50 MG tablet Take 1 tablet (50 mg total) by mouth at bedtime.   vitamin B-12 (CYANOCOBALAMIN) 500 MCG tablet Take 500 mcg by mouth daily.       02/24/2023    9:13 AM 02/02/2023    9:14 AM 12/14/2022    8:35 AM 11/01/2022    2:36 PM  GAD 7 : Generalized Anxiety Score  Nervous, Anxious, on Edge 0     Control/stop worrying 0     Worry too much - different things 0     Trouble relaxing 0     Restless 1     Easily annoyed or irritable 1     Afraid - awful might happen 0     Total GAD 7 Score 2     Anxiety Difficulty Not difficult at all        Information is confidential and restricted. Go to Review Flowsheets to unlock data.       02/24/2023    9:13 AM 02/02/2023    9:13 AM 12/14/2022    8:35 AM  Depression screen PHQ 2/9   Decreased Interest 1    Down, Depressed, Hopeless 1    PHQ - 2 Score 2    Altered sleeping 0    Tired, decreased energy 0    Change in appetite 0    Feeling bad or failure about yourself  0    Trouble concentrating 0    Moving slowly or fidgety/restless 1    Suicidal thoughts 0    PHQ-9 Score 3    Difficult doing work/chores Not difficult at all       Information is confidential and restricted. Go to Review Flowsheets to unlock data.    BP Readings from Last 3 Encounters:  02/24/23  110/70  01/27/23 135/85  12/01/22 120/78    Physical Exam Vitals and nursing note reviewed.  Constitutional:      General: She is not in acute distress.    Appearance: She is not diaphoretic.  HENT:     Head: Normocephalic and atraumatic.     Right Ear: External ear normal.     Left Ear: External ear normal.     Nose: Nose normal.  Eyes:     General:        Right eye: No discharge.        Left eye: No discharge.     Conjunctiva/sclera: Conjunctivae normal.     Pupils: Pupils are equal, round, and reactive to light.  Neck:     Thyroid : No thyroid  mass, thyromegaly or thyroid  tenderness.     Vascular: No JVD.  Cardiovascular:     Rate and Rhythm: Normal rate and regular rhythm.     Heart sounds: Normal heart sounds, S1 normal and S2 normal. No murmur heard.    No systolic murmur is present.     No diastolic murmur is present.     No friction rub. No gallop. No S3 or S4 sounds.  Pulmonary:     Effort: Pulmonary effort is normal.     Breath sounds: Normal breath sounds. No decreased breath sounds, wheezing, rhonchi or rales.  Abdominal:     General: Bowel sounds are normal. There is no distension.     Palpations: Abdomen is soft. There is no mass.     Tenderness: There is no abdominal tenderness. There is no guarding.  Musculoskeletal:        General: Normal range of motion.     Cervical back: Normal range of motion and neck supple.  Lymphadenopathy:     Cervical: No cervical  adenopathy.     Right cervical: No superficial, deep or posterior cervical adenopathy.    Left cervical: No superficial, deep or posterior cervical adenopathy.  Skin:    General: Skin is warm and dry.  Neurological:     Mental Status: She is alert.     Deep Tendon Reflexes: Reflexes are normal and symmetric.     Wt Readings from Last 3 Encounters:  02/24/23 208 lb 6.4 oz (94.5 kg)  01/27/23 212 lb 8.4 oz (96.4 kg)  12/01/22 215 lb 6.4 oz (97.7 kg)    BP 110/70   Pulse 79   Ht 5' 3 (1.6 m)   Wt 208 lb 6.4 oz (94.5 kg)   LMP 01/19/2023 (Approximate)   SpO2 95%   BMI 36.92 kg/m   Assessment and Plan: 1. Hypothyroidism, unspecified type (Primary) Chronic.  Controlled.  Stable.  Asymptomatic.  Tolerating current level of levothyroxine  which is 75 mcg daily.  This is taken as 1-1/2 50 mcg tablet and will continue at current dosing pending TSH approval. - TSH  2. Primary insomnia Chronic.  Controlled.  Stable.  Symptomatic control of her insomnia with trazodone  50 mg nightly.  Patient is tolerating well and would like to continue with this medication and we will refill for another 6 months. - traZODone  (DESYREL ) 50 MG tablet; Take 1 tablet (50 mg total) by mouth at bedtime.  Dispense: 90 tablet; Refill: 1      Cathryne Molt, MD

## 2023-02-25 ENCOUNTER — Other Ambulatory Visit: Payer: Self-pay | Admitting: Psychiatry

## 2023-02-25 ENCOUNTER — Encounter: Payer: Self-pay | Admitting: Family Medicine

## 2023-02-25 DIAGNOSIS — F4310 Post-traumatic stress disorder, unspecified: Secondary | ICD-10-CM

## 2023-02-25 LAB — TSH: TSH: 2.14 u[IU]/mL (ref 0.450–4.500)

## 2023-03-06 ENCOUNTER — Ambulatory Visit (INDEPENDENT_AMBULATORY_CARE_PROVIDER_SITE_OTHER): Payer: Self-pay | Admitting: Professional Counselor

## 2023-03-06 DIAGNOSIS — F4310 Post-traumatic stress disorder, unspecified: Secondary | ICD-10-CM

## 2023-03-06 DIAGNOSIS — F1591 Other stimulant use, unspecified, in remission: Secondary | ICD-10-CM

## 2023-03-06 DIAGNOSIS — F1021 Alcohol dependence, in remission: Secondary | ICD-10-CM

## 2023-03-06 DIAGNOSIS — F1421 Cocaine dependence, in remission: Secondary | ICD-10-CM

## 2023-03-06 DIAGNOSIS — F39 Unspecified mood [affective] disorder: Secondary | ICD-10-CM
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
## 2023-03-11 ENCOUNTER — Other Ambulatory Visit: Payer: Self-pay | Admitting: Psychiatry

## 2023-03-11 DIAGNOSIS — F4310 Post-traumatic stress disorder, unspecified: Secondary | ICD-10-CM

## 2023-03-14 ENCOUNTER — Telehealth (INDEPENDENT_AMBULATORY_CARE_PROVIDER_SITE_OTHER): Payer: Commercial Managed Care - PPO | Admitting: Psychiatry

## 2023-03-14 ENCOUNTER — Encounter: Payer: Self-pay | Admitting: Psychiatry

## 2023-03-14 DIAGNOSIS — F39 Unspecified mood [affective] disorder: Secondary | ICD-10-CM | POA: Diagnosis not present

## 2023-03-14 DIAGNOSIS — F1421 Cocaine dependence, in remission: Secondary | ICD-10-CM | POA: Diagnosis not present

## 2023-03-14 DIAGNOSIS — F4310 Post-traumatic stress disorder, unspecified: Secondary | ICD-10-CM | POA: Diagnosis not present

## 2023-03-14 DIAGNOSIS — F902 Attention-deficit hyperactivity disorder, combined type: Secondary | ICD-10-CM | POA: Diagnosis not present

## 2023-03-14 DIAGNOSIS — F1021 Alcohol dependence, in remission: Secondary | ICD-10-CM

## 2023-03-14 DIAGNOSIS — F1291 Cannabis use, unspecified, in remission: Secondary | ICD-10-CM

## 2023-03-14 DIAGNOSIS — F1591 Other stimulant use, unspecified, in remission: Secondary | ICD-10-CM

## 2023-03-14 MED ORDER — DIVALPROEX SODIUM ER 500 MG PO TB24
500.0000 mg | ORAL_TABLET | Freq: Every day | ORAL | 1 refills | Status: DC
Start: 2023-03-14 — End: 2023-05-16
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
## 2023-03-16 ENCOUNTER — Ambulatory Visit: Payer: Commercial Managed Care - PPO | Admitting: Internal Medicine

## 2023-04-03 ENCOUNTER — Ambulatory Visit: Payer: Self-pay | Admitting: Professional Counselor

## 2023-04-06 ENCOUNTER — Other Ambulatory Visit: Payer: Self-pay | Admitting: Psychiatry

## 2023-04-06 DIAGNOSIS — F39 Unspecified mood [affective] disorder: Secondary | ICD-10-CM

## 2023-04-17 ENCOUNTER — Ambulatory Visit: Payer: Self-pay | Admitting: Professional Counselor

## 2023-05-01 ENCOUNTER — Ambulatory Visit (INDEPENDENT_AMBULATORY_CARE_PROVIDER_SITE_OTHER): Payer: Self-pay | Admitting: Professional Counselor

## 2023-05-01 DIAGNOSIS — Z91199 Patient's noncompliance with other medical treatment and regimen due to unspecified reason: Secondary | ICD-10-CM

## 2023-05-02 NOTE — Progress Notes (Signed)
 Patient no-showed today's appointment; appointment was for 05/01/23 for outpatient therapy.

## 2023-05-16 ENCOUNTER — Telehealth (INDEPENDENT_AMBULATORY_CARE_PROVIDER_SITE_OTHER): Payer: Commercial Managed Care - PPO | Admitting: Psychiatry

## 2023-05-16 ENCOUNTER — Encounter: Payer: Self-pay | Admitting: Psychiatry

## 2023-05-16 DIAGNOSIS — F902 Attention-deficit hyperactivity disorder, combined type: Secondary | ICD-10-CM | POA: Diagnosis not present

## 2023-05-16 DIAGNOSIS — F1291 Cannabis use, unspecified, in remission: Secondary | ICD-10-CM

## 2023-05-16 DIAGNOSIS — F4310 Post-traumatic stress disorder, unspecified: Secondary | ICD-10-CM

## 2023-05-16 DIAGNOSIS — F1421 Cocaine dependence, in remission: Secondary | ICD-10-CM | POA: Diagnosis not present

## 2023-05-16 DIAGNOSIS — Z79899 Other long term (current) drug therapy: Secondary | ICD-10-CM

## 2023-05-16 DIAGNOSIS — F1591 Other stimulant use, unspecified, in remission: Secondary | ICD-10-CM

## 2023-05-16 DIAGNOSIS — F39 Unspecified mood [affective] disorder: Secondary | ICD-10-CM

## 2023-05-16 DIAGNOSIS — F1021 Alcohol dependence, in remission: Secondary | ICD-10-CM

## 2023-05-16 MED ORDER — DIVALPROEX SODIUM ER 500 MG PO TB24
1000.0000 mg | ORAL_TABLET | Freq: Every day | ORAL | 1 refills | Status: DC
Start: 2023-05-16 — End: 2023-10-20
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
## 2023-06-14 ENCOUNTER — Encounter: Payer: Self-pay | Admitting: Psychiatry

## 2023-06-14 ENCOUNTER — Telehealth: Admitting: Psychiatry

## 2023-06-14 DIAGNOSIS — F4310 Post-traumatic stress disorder, unspecified: Secondary | ICD-10-CM

## 2023-06-14 DIAGNOSIS — F902 Attention-deficit hyperactivity disorder, combined type: Secondary | ICD-10-CM | POA: Diagnosis not present

## 2023-06-14 DIAGNOSIS — F1591 Other stimulant use, unspecified, in remission: Secondary | ICD-10-CM

## 2023-06-14 DIAGNOSIS — F1021 Alcohol dependence, in remission: Secondary | ICD-10-CM

## 2023-06-14 DIAGNOSIS — F1291 Cannabis use, unspecified, in remission: Secondary | ICD-10-CM

## 2023-06-14 DIAGNOSIS — F39 Unspecified mood [affective] disorder: Secondary | ICD-10-CM

## 2023-06-14 DIAGNOSIS — F1421 Cocaine dependence, in remission: Secondary | ICD-10-CM | POA: Diagnosis not present

## 2023-06-14 NOTE — Progress Notes (Signed)
 Virtual Visit via Video Note  I connected with Emma Phillips on 06/14/23 at 11:00 AM EDT by a video enabled telemedicine application and verified that I am speaking with the correct person using two identifiers.  Location Provider Location : [facility] Patient Location : Home  Participants: Patient , Provider   I discussed the limitations of evaluation and management by telemedicine and the availability of in person appointments. The patient expressed understanding and agreed to proceed.   I discussed the assessment and treatment plan with the patient. The patient was provided an opportunity to ask questions and all were answered. The patient agreed with the plan and demonstrated an understanding of the instructions.   The patient was advised to call back or seek an in-person evaluation if the symptoms worsen or if the condition fails to improve as anticipated.   BH MD OP Progress Note  06/14/2023 11:12 AM Declynn Lopresti  MRN:  [id number]  Chief Complaint:  Chief Complaint  Patient presents with   Follow-up   Depression   Anxiety   Medication Refill   Discussed the use of AI scribe software for clinical note transcription with the patient, who gave verbal consent to proceed.  History of Present Illness Emma Phillips is a 38 year old Caucasian female, married, stay-at-home mom, lives in [place], has a history of PTSD, episodic mood disorder, attention concentration deficit, cocaine, alcohol, methamphetamine, cannabis use disorder in remission was evaluated by telemedicine today.  She is experiencing significant stress and mood disturbances following the death of her mother. She has more good days than bad days currently, although both persist. Additional stressors include caring for her brother's large puppy, which has been frequently left in her care, and ongoing confrontations with a neighbor, which have been distressing.  She is currently on Depakote , which was increased to  1000 mg daily at her last visit. She also takes Celexa  15 mg, Minipress  2 mg, and Trazodone  50 mg. She has not yet completed the required lab work following the increase in Depakote  dosage. She believes the higher dose of Depakote  has contributed to having more good days.  She has not been able to schedule an appointment with her therapist due to time constraints. Additionally, she needs to find a new gastroenterologist as her previous one is now only working in a hospital setting.  Denies suicidal thoughts or homicidality or perceptual disturbances.  She reports sleeping well.   She has no concerns about medication side effects and believes she is up to date with her medication refills.   Visit Diagnosis:    ICD-10-CM   1. PTSD (post-traumatic stress disorder)  F43.10     2. Episodic mood disorder (HCC)  F39    R/O Bipolar 2    3. ADHD (attention deficit hyperactivity disorder), combined type  F90.2     4. Cocaine use disorder, severe, in sustained remission (HCC)  F14.21     5. Alcohol use disorder, moderate, in sustained remission (HCC)  F10.21     6. Cannabis use disorder in remission  F12.91     7. History of methamphetamine use  F15.91       Past Psychiatric History: I have reviewed past psychiatric history from progress note on 10/17/2022.  Past Medical History:  Past Medical History:  Diagnosis Date   ADHD (attention deficit hyperactivity disorder)    Anxiety    Bipolar 1 disorder (HCC)    Insomnia     Past Surgical History:  Procedure Laterality Date  COLPOSCOPY     Per patient age 59   INTRAUTERINE DEVICE (IUD) INSERTION  2018   TIBIA IM NAIL INSERTION Right 09/03/2021   Procedure: INTRAMEDULLARY (IM) NAIL TIBIAL;  Surgeon: Rollene Cough, MD;  Location: [facility] ORS;  Service: Orthopedics;  Laterality: Right;    Family Psychiatric History: I have reviewed family psychiatric history from progress note on 10/17/2022.  Family History:  Family History  Problem  Relation Age of Onset   Drug abuse Mother    Alcohol abuse Mother    Bipolar disorder Mother    Cancer Mother    Thyroid  disease Mother    Cancer Father    ADD / ADHD Brother    Heart disease Maternal Grandfather    Cancer Maternal Grandmother    Breast cancer Paternal Grandmother    Cancer Paternal Grandmother     Social History: I have reviewed social history from progress note on 10/17/2022. 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   Drug use: Not Currently    Comment: History of cocaine, alcohol, cannabis abuse currently in remission.  Brief history of methamphetamine abuse.   Sexual activity: Yes    Partners: Male    Birth control/protection: I.U.D.  Other Topics Concern   Not on file  Social History Narrative   Not on file   Social Drivers of Health   Financial Resource Strain: Low Risk  (03/06/2023)   Overall Financial Resource Strain (CARDIA)    Difficulty of Paying Living Expenses: Not very hard  Food Insecurity: No Food Insecurity (03/06/2023)   Hunger Vital Sign    Worried About Running Out of Food in the Last Year: Never true    Ran Out of Food in the Last Year: Never true  Transportation Needs: No Transportation Needs (03/06/2023)   PRAPARE - Administrator, Civil Service (Medical): No    Lack of Transportation (Non-Medical): No  Physical Activity: Inactive (03/06/2023)   Exercise  Vital Sign    Days of Exercise per Week: 0 days    Minutes of Exercise per Session: 0 min  Stress: Stress Concern Present (03/06/2023)   [employer] of Occupational Health - Occupational Stress Questionnaire    Feeling of Stress : To some extent  Social Connections: Socially Isolated (03/06/2023)   Social Connection and Isolation Panel [NHANES]    Frequency of Communication with Friends and Family: Never    Frequency of Social Gatherings with Friends and Family: Never    Attends Religious Services: Never    Database Administrator or Organizations: No    Attends Banker Meetings: Never    Marital Status: Married    Allergies:  Allergies  Allergen Reactions   Penicillins Anaphylaxis    Metabolic Disorder Labs: Lab Results  Component Value Date   HGBA1C 5.4 02/17/2022  No results found for: PROLACTIN Lab Results  Component Value Date   CHOL 160 02/17/2022   TRIG 90 02/17/2022   HDL 41 02/17/2022   LDLCALC 102 (H) 02/17/2022   LDLCALC 102 (H) 02/17/2021   Lab Results  Component Value Date   TSH 2.140 02/24/2023   TSH 2.240 10/07/2022    Therapeutic Level Labs: No results found for: LITHIUM Lab Results  Component Value Date   VALPROATE 53 10/24/2022   No results found for: CBMZ  Current Medications: Current Outpatient Medications  Medication Sig Dispense Refill   amphetamine-dextroamphetamine (ADDERALL XR) 25 MG 24 hr capsule Take by mouth every morning.     b complex vitamins capsule Take 1 capsule by mouth daily.     citalopram  (CELEXA ) 10 MG tablet TAKE 1 AND 1/2 TABLETS DAILY BY MOUTH 135 tablet 1   divalproex  (DEPAKOTE  ER) 500 MG 24 hr tablet Take 2 tablets (1,000 mg total) by mouth daily with supper. 180 tablet 1   levothyroxine  (SYNTHROID ) 50 MCG tablet Take 1.5 tablets (75 mcg total) by mouth daily. 90 tablet 1   linaclotide  (LINZESS ) 145 MCG CAPS capsule Take 1 capsule (145 mcg total) by mouth daily before breakfast. 90 capsule 2    prazosin  (MINIPRESS ) 2 MG capsule TAKE 2 CAPSULES (4 MG TOTAL) BY MOUTH AT BEDTIME. 180 capsule 1   traZODone  (DESYREL ) 50 MG tablet Take 1 tablet (50 mg total) by mouth at bedtime. 90 tablet 1   vitamin B-12 (CYANOCOBALAMIN) 500 MCG tablet Take 500 mcg by mouth daily.     No current facility-administered medications for this visit.     Musculoskeletal: Strength & Muscle Tone: UTA Gait & Station: Seated Patient leans: N/A  Psychiatric Specialty Exam: Review of Systems  Psychiatric/Behavioral:  The patient is nervous/anxious.        Grief    There were no vitals taken for this visit.There is no height or weight on file to calculate BMI.  General Appearance: Casual  Eye Contact:  Fair  Speech:  Clear and Coherent  Volume:  Normal  Mood:  Grieving, anxious  Affect:  Congruent  Thought Process:  Goal Directed and Descriptions of Associations: Intact  Orientation:  Full (Time, Place, and Person)  Thought Content: Logical   Suicidal Thoughts:  No  Homicidal Thoughts:  No  Memory:  Immediate;   Fair Recent;   Fair Remote;   Fair  Judgement:  Fair  Insight:  Fair  Psychomotor Activity:  Normal  Concentration:  Concentration: Fair and Attention Span: Fair  Recall:  [employer] of Knowledge: Fair  Language: Fair  Akathisia:  No  Handed:  Right  AIMS (if indicated): not done  Assets:  Communication Skills Desire for Improvement Housing Intimacy Social Support Talents/Skills  ADL's:  Intact  Cognition: WNL  Sleep:  improving   Screenings: GAD-7    Advertising Copywriter from 03/06/2023 in [facility] Office Visit from 02/24/2023 in [facility] at [facility] Office Visit from 02/02/2023 in [facility] Office Visit from 12/13/2022 in [facility] Office Visit from 11/01/2022 in [facility]  Total GAD-7 Score 7 2 4 11 15       PHQ2-9    Flowsheet Row Counselor from 03/06/2023 in [facility] Office Visit from 02/24/2023 in [facility] at [facility] Visit  from 02/02/2023 in [facility] Office Visit from 12/13/2022 in [facility] Office Visit from 11/01/2022 in [facility]  PHQ-2 Total Score 1 2 2 2 4   PHQ-9 Total Score 7 3 7 10 19       Flowsheet Row Video Visit from 06/14/2023 in [facility] Video Visit from 05/16/2023 in [facility] Video Visit from 03/14/2023 in [facility]  C-SSRS RISK CATEGORY No Risk No Risk Low Risk        Assessment and Plan: Amandine Covino is a 38 year old Caucasian female with history of PTSD, episodic mood disorder, ADHD, history of polysubstance abuse in remission was evaluated by telemedicine today.  Discussed assessment and plan as noted below.  Posttraumatic stress disorder-improving Currently reports symptoms as improving on the current medication regimen.  Motivated to start CBT. - Continue Prazosin  4 mg at bedtime - Encouraged to establish care with psychotherapist. - Continue Celexa  15 mg daily. - Continue Trazodone  50 mg at bedtime  Episodic mood disorder-improving Currently reports more good days than bad days although continues to have mood lability likely due to multiple situational stressors including the recent death of her mother. - Continue Depakote  1000 mg daily with supper - Depakote  levels-pending patient has been noncompliant.  Encouraged compliance.  ADHD-improving Currently continues to be on Adderall and is under the care of [place] attention specialist. - Continue Adderall extended  release 25 mg daily  History of polysubstance abuse-currently in remission.  Follow-up Follow-up in clinic in 2 months or sooner if needed.   Collaboration of Care: Collaboration of Care: Referral or follow-up with counselor/therapist AEB encouraged to establish care with therapist was referred to Ms. Veva at our practice.  Patient/Guardian was advised Release of Information must be obtained prior to any record release in order to collaborate their care with an outside provider. Patient/Guardian was advised if they have not already done so to contact the registration department to sign all necessary forms in order for us  to release information regarding their care.   Consent: Patient/Guardian gives verbal consent for treatment and assignment of benefits for services provided during this visit. Patient/Guardian expressed understanding and agreed to proceed.  This note was generated in part or whole with voice recognition software. Voice recognition is usually quite accurate but there are transcription errors that can and very often do occur. I apologize for any typographical errors that were not detected and corrected.     Nel Stoneking, MD 06/16/2023, 7:45 AM

## 2023-07-20 ENCOUNTER — Telehealth: Payer: Self-pay | Admitting: Student

## 2023-07-20 DIAGNOSIS — E039 Hypothyroidism, unspecified: Secondary | ICD-10-CM

## 2023-07-20 NOTE — Telephone Encounter (Unsigned)
 Copied from CRM [id number]. Topic: Clinical - Medication Refill >> Jul 20, 2023  3:04 PM Delon DASEN wrote: Medication: levothyroxine  (SYNTHROID ) 50 MCG tablet  Has the patient contacted their pharmacy? Yes (Agent: If no, request that the patient contact the pharmacy for the refill. If patient does not wish to contact the pharmacy document the reason why and proceed with request.) (Agent: If yes, when and what did the pharmacy advise?)  This is the patient's preferred pharmacy:  [id number] - [place], [place] - [address] [place] [address] Phone: [phone] Fax: [phone]  Is this the correct pharmacy for this prescription? Yes If no, delete pharmacy and type the correct one.   Has the prescription been filled recently? Yes  Is the patient out of the medication? Yes  Has the patient been seen for an appointment in the last year OR does the patient have an upcoming appointment? Yes  Can we respond through MyChart? Yes  Agent: Please be advised that Rx refills may take up to 3 business days. We ask that you follow-up with your pharmacy.

## 2023-07-24 ENCOUNTER — Telehealth (INDEPENDENT_AMBULATORY_CARE_PROVIDER_SITE_OTHER): Payer: Commercial Managed Care - PPO | Admitting: Student

## 2023-07-24 ENCOUNTER — Encounter: Payer: Self-pay | Admitting: Student

## 2023-07-24 DIAGNOSIS — E039 Hypothyroidism, unspecified: Secondary | ICD-10-CM | POA: Diagnosis not present

## 2023-07-24 DIAGNOSIS — Z8742 Personal history of other diseases of the female genital tract: Secondary | ICD-10-CM | POA: Insufficient documentation

## 2023-07-24 DIAGNOSIS — K581 Irritable bowel syndrome with constipation: Secondary | ICD-10-CM

## 2023-07-24 DIAGNOSIS — G4700 Insomnia, unspecified: Secondary | ICD-10-CM | POA: Insufficient documentation

## 2023-07-24 DIAGNOSIS — K589 Irritable bowel syndrome without diarrhea: Secondary | ICD-10-CM | POA: Insufficient documentation

## 2023-07-24 DIAGNOSIS — F5101 Primary insomnia: Secondary | ICD-10-CM

## 2023-07-24 MED ORDER — TRAZODONE HCL 50 MG PO TABS
50.0000 mg | ORAL_TABLET | Freq: Every day | ORAL | 1 refills | Status: DC
Start: 2023-07-24 — End: 2023-10-20

## 2023-07-24 MED ORDER — LEVOTHYROXINE SODIUM 75 MCG PO TABS: 75.0000 ug | ORAL_TABLET | Freq: Every day | ORAL | 3 refills | Status: AC

## 2023-07-24 NOTE — Assessment & Plan Note (Signed)
 Continue levothyroxine  75 mcg daily.  TSH at next visit.

## 2023-07-24 NOTE — Assessment & Plan Note (Addendum)
 Previously seeing Dr. Jinny at [facility].  Doing well on linzesss 145 mcg daily.  Just picked up 26-month supply.  She would like to continue to see [facility] when there is a new provider. She with work with GI office regarding this and message me if she needs assistance with referral.

## 2023-07-24 NOTE — Assessment & Plan Note (Signed)
 Prior pap NILM HPV+  in 2019, underwent colposcopy with Dr. Lake on 08/29/2017.  Pathology showed  normal squamouas and endocervical mucosaa without evidence of malignancy, reccomended repeat PAP in 1 year however she did not follow up. Will have her follow up for pap in 1-2 months.

## 2023-07-24 NOTE — Assessment & Plan Note (Addendum)
 Continue trazodone  50 mg daily can try at 100 mg on days she is having more difficulty sleeping.  I have refilled her trazodone  she will send a MyChart message about 100 mg effective.

## 2023-07-24 NOTE — Progress Notes (Signed)
 Virtual Visit via Video Note  I connected with Emma Phillips on 07/24/23 at  8:00 AM EDT by a video enabled telemedicine application and verified that I am speaking with the correct person using two identifiers.  Patient Location: Home Provider Location: Home Office  I discussed the limitations, risks, security, and privacy concerns of performing an evaluation and management service by video and the availability of in person appointments. I also discussed with the patient that there may be a patient responsible charge related to this service. The patient expressed understanding and agreed to proceed.  Subjective: PCP: Lemon Raisin, MD  Chief Complaint  Patient presents with   Hypothyroidism   Emma Phillips presents today for transfer of care. Previously seeing Dr. Joshua who recently retired. She is following up on hypothyroidism and insomnia. Reports trazodone  helps with her sleep as much as it used to.  Mother recently passed and this has been affecting her mood more.  Reports her valproic acid  was recently increased by her psychiatrist.  Reports she is doing well on levothyroxine .  Denies diarrhea, constipation, heat or cold intolerance, hair or skin changes.  ROS: Per HPI  Current Outpatient Medications:    amphetamine-dextroamphetamine (ADDERALL XR) 25 MG 24 hr capsule, Take by mouth every morning., Disp: , Rfl:    b complex vitamins capsule, Take 1 capsule by mouth daily., Disp: , Rfl:    citalopram  (CELEXA ) 10 MG tablet, TAKE 1 AND 1/2 TABLETS DAILY BY MOUTH, Disp: 135 tablet, Rfl: 1   divalproex  (DEPAKOTE  ER) 500 MG 24 hr tablet, Take 2 tablets (1,000 mg total) by mouth daily with supper., Disp: 180 tablet, Rfl: 1   levothyroxine  (SYNTHROID ) 75 MCG tablet, Take 1 tablet (75 mcg total) by mouth daily., Disp: 90 tablet, Rfl: 3   linaclotide  (LINZESS ) 145 MCG CAPS capsule, Take 1 capsule (145 mcg total) by mouth daily before breakfast., Disp: 90 capsule, Rfl: 2    prazosin  (MINIPRESS ) 2 MG capsule, TAKE 2 CAPSULES (4 MG TOTAL) BY MOUTH AT BEDTIME., Disp: 180 capsule, Rfl: 1   traZODone  (DESYREL ) 50 MG tablet, Take 1 tablet (50 mg total) by mouth at bedtime., Disp: 90 tablet, Rfl: 1   vitamin B-12 (CYANOCOBALAMIN) 500 MCG tablet, Take 500 mcg by mouth daily., Disp: , Rfl:   Observations/Objective: There were no vitals filed for this visit. Physical Exam Constitutional:      Appearance: Normal appearance.  HENT:     Head: Normocephalic and atraumatic.  Eyes:     Extraocular Movements: Extraocular movements intact.     Pupils: Pupils are equal, round, and reactive to light.  Pulmonary:     Effort: Pulmonary effort is normal. No respiratory distress.  Neurological:     Mental Status: She is alert and oriented to person, place, and time. Mental status is at baseline.  Psychiatric:        Mood and Affect: Mood normal.        Behavior: Behavior normal.     Assessment and Plan: Hypothyroidism, unspecified type Assessment & Plan: Continue levothyroxine  75 mcg daily.  TSH at next visit.  Orders: -     Levothyroxine  Sodium; Take 1 tablet (75 mcg total) by mouth daily.  Dispense: 90 tablet; Refill: 3  Irritable bowel syndrome with constipation Assessment & Plan: Previously seeing Dr. Jinny at [facility].  Doing well on linzesss 145 mcg daily.  Just picked up 38-month supply.  She would like to continue to see [facility] when there is a new  provider. She with work with GI office regarding this and message me if she needs assistance with referral.    Primary insomnia Assessment & Plan: Continue trazodone  50 mg daily can try at 100 mg on days she is having more difficulty sleeping.  I have refilled her trazodone  she will send a MyChart message about 100 mg effective.  Orders: -     traZODone  HCl; Take 1 tablet (50 mg total) by mouth at bedtime.  Dispense: 90 tablet; Refill: 1  History of abnormal cervical Pap smear Assessment & Plan: Prior pap  NILM HPV+  in 2019, underwent colposcopy with Dr. Lake on 08/29/2017.  Pathology showed  normal squamouas and endocervical mucosaa without evidence of malignancy, reccomended repeat PAP in 1 year however she did not follow up. Will have her follow up for pap in 1-2 months.     Follow Up Instructions: Return in about 2 months (around 09/24/2023) for pap, physical.   I discussed the assessment and treatment plan with the patient. The patient was provided an opportunity to ask questions, and all were answered. The patient agreed with the plan and demonstrated an understanding of the instructions.   The patient was advised to call back or seek an in-person evaluation if the symptoms worsen or if the condition fails to improve as anticipated.  The above assessment and management plan was discussed with the patient. The patient verbalized understanding of and has agreed to the management plan.   Harlene Saddler, MD  I personally spent a total of 35 minutes in the care of the patient today including counseling and educating, placing orders, and documenting clinical information in the EHR.

## 2023-08-06 ENCOUNTER — Other Ambulatory Visit: Payer: Self-pay | Admitting: Psychiatry

## 2023-08-06 DIAGNOSIS — F4310 Post-traumatic stress disorder, unspecified: Secondary | ICD-10-CM

## 2023-08-17 ENCOUNTER — Telehealth: Admitting: Psychiatry

## 2023-08-17 ENCOUNTER — Other Ambulatory Visit
Admission: RE | Admit: 2023-08-17 | Discharge: 2023-08-17 | Disposition: A | Discharge status: Home / Self Care | Attending: Psychiatry | Admitting: Psychiatry

## 2023-08-17 ENCOUNTER — Encounter: Payer: Self-pay | Admitting: Psychiatry

## 2023-08-17 ENCOUNTER — Ambulatory Visit: Payer: Self-pay | Admitting: Psychiatry

## 2023-08-17 DIAGNOSIS — Z79899 Other long term (current) drug therapy: Secondary | ICD-10-CM | POA: Diagnosis present

## 2023-08-17 DIAGNOSIS — F1021 Alcohol dependence, in remission: Secondary | ICD-10-CM

## 2023-08-17 DIAGNOSIS — F39 Unspecified mood [affective] disorder: Secondary | ICD-10-CM | POA: Diagnosis present

## 2023-08-17 DIAGNOSIS — F4310 Post-traumatic stress disorder, unspecified: Secondary | ICD-10-CM | POA: Diagnosis not present

## 2023-08-17 DIAGNOSIS — F1291 Cannabis use, unspecified, in remission: Secondary | ICD-10-CM

## 2023-08-17 DIAGNOSIS — F902 Attention-deficit hyperactivity disorder, combined type: Secondary | ICD-10-CM

## 2023-08-17 DIAGNOSIS — F1421 Cocaine dependence, in remission: Secondary | ICD-10-CM

## 2023-08-17 DIAGNOSIS — F1591 Other stimulant use, unspecified, in remission: Secondary | ICD-10-CM

## 2023-08-17 LAB — HEPATIC FUNCTION PANEL
ALT: 21 U/L (ref 0–44)
AST: 26 U/L (ref 15–41)
Albumin: 3.7 g/dL (ref 3.5–5.0)
Alkaline Phosphatase: 51 U/L (ref 38–126)
Bilirubin, Direct: <0.1 mg/dL (ref 0.0–0.2)
Total Bilirubin: 0.8 mg/dL (ref 0.0–1.2)
Total Protein: 6.7 g/dL (ref 6.5–8.1)

## 2023-08-17 LAB — VALPROIC ACID LEVEL: Valproic Acid Lvl: 50 ug/mL (ref 50–100)

## 2023-08-17 LAB — SODIUM: Sodium: 139 mmol/L (ref 135–145)

## 2023-08-17 LAB — PLATELET COUNT: Platelets: 190 K/uL (ref 150–400)

## 2023-08-17 NOTE — Progress Notes (Unsigned)
 Virtual Visit via Video Note  I connected with Emma Phillips on 08/17/23 at  4:20 PM EDT by a video enabled telemedicine application and verified that I am speaking with the correct person using two identifiers.  Location Provider Location : [facility] Patient Location : Home  Participants: Patient , Provider   I discussed the limitations of evaluation and management by telemedicine and the availability of in person appointments. The patient expressed understanding and agreed to proceed.    I discussed the assessment and treatment plan with the patient. The patient was provided an opportunity to ask questions and all were answered. The patient agreed with the plan and demonstrated an understanding of the instructions.   The patient was advised to call back or seek an in-person evaluation if the symptoms worsen or if the condition fails to improve as anticipated.  BH MD OP Progress Note  08/18/2023 8:21 AM Emma Phillips  MRN:  [id number]  Chief Complaint:  Chief Complaint  Patient presents with   Follow-up   Depression   Anxiety   Medication Refill   HPI: Emma Phillips is a 38 year old Caucasian female, married, stay-at-home mom, lives in [place], has a history of PTSD, episodic mood disorder, attention concentration deficit, cocaine, alcohol, methamphetamine, cannabis use in remission was evaluated by telemedicine today.  She continues to experience episodic irritability, anger which leads to outbursts with her family members especially her spouse.  She however catches herself and is able to distract herself after back.  She is currently working on coping strategies.  She is currently not able to afford individual therapy and hence has been working with an APP that has AI assisted therapy.  She wants to give it a chance.  She is currently compliant on her medications as prescribed including Depakote , Celexa , prazosin  and trazodone .  She was able to get her labs completed this  morning.  Labs were reviewed and discussed with patient all labs including Depakote  level within acceptable range.  She reports sleep is overall good.  Denies any nightmares or flashbacks at this time.  Prazosin  and trazodone  has been beneficial.  She currently denies any suicidality, homicidality or perceptual disturbances.  She continues to follow up with her provider for management of ADHD and is currently on Adderall 25 mg.  That seems to help better.  She denies any other concerns today.  Visit Diagnosis:    ICD-10-CM   1. PTSD (post-traumatic stress disorder)  F43.10     2. Episodic mood disorder (HCC)  F39    Rule out bipolar disorder type II    3. ADHD (attention deficit hyperactivity disorder), combined type  F90.2     4. Cocaine use disorder, severe, in sustained remission (HCC)  F14.21     5. Alcohol use disorder, moderate, in sustained remission (HCC)  F10.21     6. Cannabis use disorder in remission  F12.91     7. History of methamphetamine use  F15.91       Past Psychiatric History: I have reviewed past psychiatric history from progress note on 10/17/2022.  Past Medical History:  Past Medical History:  Diagnosis Date   ADHD (attention deficit hyperactivity disorder)    Anxiety    Bipolar 1 disorder (HCC)    Insomnia     Past Surgical History:  Procedure Laterality Date   COLPOSCOPY     Per patient age 48   INTRAUTERINE DEVICE (IUD) INSERTION  2018   TIBIA IM NAIL INSERTION Right 09/03/2021   Procedure:  INTRAMEDULLARY (IM) NAIL TIBIAL;  Surgeon: Rollene Cough, MD;  Location: [facility] ORS;  Service: Orthopedics;  Laterality: Right;    Family Psychiatric History: I have reviewed family psychiatric history from progress note on 10/17/2022.  Family History:  Family History  Problem Relation Age of Onset   Drug abuse Mother    Alcohol abuse Mother    Bipolar disorder Mother    Cancer Mother    Thyroid  disease Mother    Cancer Father    ADD / ADHD  Brother    Heart disease Maternal Grandfather    Cancer Maternal Grandmother    Breast cancer Paternal Grandmother    Cancer Paternal Grandmother     Social History: I have reviewed social history from progress note on 10/17/2022. 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   Drug use: Not Currently    Comment: History of cocaine, alcohol, cannabis abuse currently in remission.  Brief history of methamphetamine abuse.   Sexual activity: Yes    Partners: Male    Birth control/protection: I.U.D.  Other Topics Concern   Not on file  Social History Narrative   Not on file   Social Drivers of Health   Financial Resource Strain: Low Risk  (03/06/2023)   Overall Financial Resource Strain (CARDIA)    Difficulty of Paying Living Expenses: Not very hard  Food Insecurity: No Food Insecurity (03/06/2023)   Hunger Vital Sign    Worried About Running Out of Food in the Last Year: Never true    Ran Out of Food in the Last Year: Never true  Transportation Needs: No Transportation Needs (03/06/2023)   PRAPARE - Administrator, Civil Service (Medical): No    Lack of Transportation (Non-Medical): No  Physical Activity: Inactive (03/06/2023)   Exercise Vital Sign    Days of Exercise per Week: 0 days    Minutes of Exercise per Session: 0 min  Stress: Stress Concern Present (03/06/2023)   [employer] of Occupational  Health - Occupational Stress Questionnaire    Feeling of Stress : To some extent  Social Connections: Socially Isolated (03/06/2023)   Social Connection and Isolation Panel    Frequency of Communication with Friends and Family: Never    Frequency of Social Gatherings with Friends and Family: Never    Attends Religious Services: Never    Database administrator or Organizations: No    Attends Banker Meetings: Never    Marital Status: Married    Allergies:  Allergies  Allergen Reactions   Penicillins Anaphylaxis    Metabolic Disorder Labs: Lab Results  Component Value Date   HGBA1C 5.4 02/17/2022   No results found for: PROLACTIN Lab Results  Component Value Date   CHOL 160 02/17/2022   TRIG 90 02/17/2022   HDL 41 02/17/2022  LDLCALC 102 (H) 02/17/2022   LDLCALC 102 (H) 02/17/2021   Lab Results  Component Value Date   TSH 2.140 02/24/2023   TSH 2.240 10/07/2022    Therapeutic Level Labs: No results found for: LITHIUM Lab Results  Component Value Date   VALPROATE 50 08/17/2023   VALPROATE 53 10/24/2022   No results found for: CBMZ  Current Medications: Current Outpatient Medications  Medication Sig Dispense Refill   amphetamine-dextroamphetamine (ADDERALL XR) 25 MG 24 hr capsule Take by mouth every morning.     b complex vitamins capsule Take 1 capsule by mouth daily.     citalopram  (CELEXA ) 10 MG tablet TAKE 1 AND 1/2 TABLETS BY MOUTH DAILY 135 tablet 1   divalproex  (DEPAKOTE  ER) 500 MG 24 hr tablet Take 2 tablets (1,000 mg total) by mouth daily with supper. 180 tablet 1   levothyroxine  (SYNTHROID ) 75 MCG tablet Take 1 tablet (75 mcg total) by mouth daily. 90 tablet 3   linaclotide  (LINZESS ) 145 MCG CAPS capsule Take 1 capsule (145 mcg total) by mouth daily before breakfast. 90 capsule 2   prazosin  (MINIPRESS ) 2 MG capsule TAKE 2 CAPSULES (4 MG TOTAL) BY MOUTH AT BEDTIME. 180 capsule 1   traZODone  (DESYREL ) 50 MG tablet Take 1 tablet (50 mg  total) by mouth at bedtime. 90 tablet 1   vitamin B-12 (CYANOCOBALAMIN) 500 MCG tablet Take 500 mcg by mouth daily.     No current facility-administered medications for this visit.     Musculoskeletal: Strength & Muscle Tone: UTA Gait & Station: Seated Patient leans: N/A  Psychiatric Specialty Exam: Review of Systems  Psychiatric/Behavioral:  Positive for decreased concentration.        Grieving    There were no vitals taken for this visit.There is no height or weight on file to calculate BMI.  General Appearance: Casual  Eye Contact:  Fair  Speech:  Clear and Coherent  Volume:  Normal  Mood:  grieving  Affect:  Congruent  Thought Process:  Goal Directed and Descriptions of Associations: Intact  Orientation:  Full (Time, Place, and Person)  Thought Content: Logical   Suicidal Thoughts:  No  Homicidal Thoughts:  No  Memory:  Immediate;   Fair Recent;   Fair Remote;   Fair  Judgement:  Fair  Insight:  Fair  Psychomotor Activity:  Normal  Concentration:  Concentration: Fair and Attention Span: Fair  Recall:  [employer] of Knowledge: Fair  Language: Fair  Akathisia:  No  Handed:  Right  AIMS (if indicated): not done  Assets:  Communication Skills Desire for Improvement Housing Social Support Talents/Skills Transportation  ADL's:  Intact  Cognition: WNL  Sleep:  Fair   Screenings: GAD-7    Advertising copywriter from 03/06/2023 in [facility] Office Visit from 02/24/2023 in [facility] at [facility] Office Visit from 02/02/2023 in [facility] Office Visit from 12/13/2022 in [facility] Office Visit from 11/01/2022 in [facility]  Total GAD-7 Score 7 2 4 11 15    PHQ2-9    Flowsheet Row Counselor from 03/06/2023 in [facility]  Office Visit from 02/24/2023 in [facility] at [facility] Office Visit from 02/02/2023 in [facility] Office Visit from 12/13/2022 in [facility] Office Visit from 11/01/2022 in [facility]  PHQ-2 Total Score 1 2 2 2 4   PHQ-9 Total Score 7 3 7 10 19    Flowsheet Row Video Visit from 08/17/2023 in [facility] Video Visit from 06/14/2023 in [facility] Video Visit from 05/16/2023 in [facility]  C-SSRS RISK CATEGORY No Risk No Risk No Risk     Assessment and Plan: Emma Phillips is a 38 year old Caucasian female with history of PTSD, episodic mood disorder, ADHD, history of polysubstance abuse in remission was evaluated by telemedicine today.  Discussed assessment and plan as noted below.  PTSD-improving Currently reports overall improvement with trauma related symptoms although continues to have episodic outbursts. Continue Prazosin  4 mg at bedtime Continue Celexa  15 mg daily Continue Trazodone  50 mg at bedtime Encouraged to establish care with the therapist for individual trauma focused therapy.  Episodic mood disorder-improving Continues to have episodic outbursts although improving. Continue Depakote  1000 mg daily with supper. (Depakote  level reviewed and discussed with patient dated 08/17/2023-50, therapeutic) Encouraged to reestablish care with therapist.  ADHD-improving Currently under the care of [place] attention specialist. Continue Adderall extended release 25 mg daily  History of polysubstance abuse-currently in remission.  Reviewed and discussed labs including sodium level-within normal limits, hepatic function panel and platelet count all within normal limits 08/17/2023.  Follow-up Follow-up in  clinic in 2 months or sooner if needed.   Collaboration of Care: Collaboration of Care: Referral or follow-up with counselor/therapist AEB encourage to reestablish care with therapist.  Patient/Guardian was advised Release of Information must be obtained prior to any record release in order to collaborate their care with an outside provider. Patient/Guardian was advised if they have not already done so to contact the registration department to sign all necessary forms in order for us  to release information regarding their care.   Consent: Patient/Guardian gives verbal consent for treatment and assignment of benefits for services provided during this visit. Patient/Guardian expressed understanding and agreed to proceed.   This note was generated in part or whole with voice recognition software. Voice recognition is usually quite accurate but there are transcription errors that can and very often do occur. I apologize for any typographical errors that were not detected and corrected.    Candid Bovey, MD 08/18/2023, 8:21 AM

## 2023-10-20 ENCOUNTER — Encounter: Payer: Self-pay | Admitting: Psychiatry

## 2023-10-20 ENCOUNTER — Telehealth: Admitting: Psychiatry

## 2023-10-20 DIAGNOSIS — F39 Unspecified mood [affective] disorder: Secondary | ICD-10-CM

## 2023-10-20 DIAGNOSIS — F1291 Cannabis use, unspecified, in remission: Secondary | ICD-10-CM

## 2023-10-20 DIAGNOSIS — F902 Attention-deficit hyperactivity disorder, combined type: Secondary | ICD-10-CM

## 2023-10-20 DIAGNOSIS — F1021 Alcohol dependence, in remission: Secondary | ICD-10-CM

## 2023-10-20 DIAGNOSIS — F1421 Cocaine dependence, in remission: Secondary | ICD-10-CM

## 2023-10-20 DIAGNOSIS — F1591 Other stimulant use, unspecified, in remission: Secondary | ICD-10-CM

## 2023-10-20 DIAGNOSIS — F4310 Post-traumatic stress disorder, unspecified: Secondary | ICD-10-CM

## 2023-10-20 MED ORDER — TRAZODONE HCL 50 MG PO TABS: 75.0000 mg | ORAL_TABLET | Freq: Every evening | ORAL | 0 refills | Status: DC | PRN

## 2023-10-20 MED ORDER — DIVALPROEX SODIUM ER 500 MG PO TB24: 1000.0000 mg | ORAL_TABLET | Freq: Every day | ORAL | 3 refills | Status: AC

## 2023-10-20 NOTE — Progress Notes (Signed)
 Virtual Visit via Video Note  I connected with Emma Phillips on 10/20/23 at 11:20 AM EDT by a video enabled telemedicine application and verified that I am speaking with the correct person using two identifiers.  Location Provider Location : [facility] Patient Location : Home  Participants: Patient , Provider I discussed the limitations of evaluation and management by telemedicine and the availability of in person appointments. The patient expressed understanding and agreed to proceed.   I discussed the assessment and treatment plan with the patient. The patient was provided an opportunity to ask questions and all were answered. The patient agreed with the plan and demonstrated an understanding of the instructions.   The patient was advised to call back or seek an in-person evaluation if the symptoms worsen or if the condition fails to improve as anticipated.   BH MD OP Progress Note  10/20/2023 11:47 AM Emma Phillips  MRN:  [id number]  Chief Complaint:  Chief Complaint  Patient presents with   Follow-up   Depression   Medication Refill   HPI: Emma Phillips  is a Caucasian female, married, stay-at-home mom, lives in [place], has a history of PTSD, episodic mood disorder, attention and concentration deficit, cocaine, alcohol, methamphetamine, cannabis use in remission was evaluated by telemedicine today.  Patient today reports she continues to be 'snappy' especially in her interactions with her husband.  Patient continues to have multiple situational stressors including the challenges of taking care of her child with behavioral problems as well as managing her household and taking care of her other children and her pets.  She reports that can be overwhelming at times.  She also has been struggling with her sleep since her child needs special attention at night which does interrupt her sleep.  Likely that also contributing to her mood symptoms during the day.  She denies any  significant intrusive memories flashbacks or nightmares.  She is currently compliant on medications like Depakote , Celexa , prazosin  and trazodone .  Denies side effects.  Agreeable to dosage increase of trazodone .  She denies any suicidality, homicidality or perceptual disturbances.   Visit Diagnosis:    ICD-10-CM   1. PTSD (post-traumatic stress disorder)  F43.10 traZODone  (DESYREL ) 50 MG tablet    2. Episodic mood disorder  F39 divalproex  (DEPAKOTE  ER) 500 MG 24 hr tablet   R/O Bipolar type 2    3. ADHD (attention deficit hyperactivity disorder), combined type  F90.2     4. Cocaine use disorder, severe, in sustained remission (HCC)  F14.21     5. Alcohol use disorder, moderate, in sustained remission (HCC)  F10.21     6. Cannabis use disorder in remission  F12.91    mild    7. History of methamphetamine use  F15.91       Past Psychiatric History: I have reviewed past psychiatric history from progress note on 10/17/2022.  Past Medical History:  Past Medical History:  Diagnosis Date   ADHD (attention deficit hyperactivity disorder)    Anxiety    Bipolar 1 disorder (HCC)    Insomnia     Past Surgical History:  Procedure Laterality Date   COLPOSCOPY     Per patient age 38   INTRAUTERINE DEVICE (IUD) INSERTION  2018   TIBIA IM NAIL INSERTION Right 09/03/2021   Procedure: INTRAMEDULLARY (IM) NAIL TIBIAL;  Surgeon: Rollene Cough, MD;  Location: [facility] ORS;  Service: Orthopedics;  Laterality: Right;    Family Psychiatric History: I have reviewed family psychiatric history from progress note on  10/17/2022.  Family History:  Family History  Problem Relation Age of Onset   Drug abuse Mother    Alcohol abuse Mother    Bipolar disorder Mother    Cancer Mother    Thyroid  disease Mother    Cancer Father    ADD / ADHD Brother    Heart disease Maternal Grandfather    Cancer Maternal Grandmother    Breast cancer Paternal Grandmother    Cancer Paternal Grandmother      Social History: I have reviewed social history from progress note on 10/17/2022. 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   Drug use: Not Currently    Comment: History of cocaine, alcohol, cannabis abuse currently in remission.  Brief history of methamphetamine abuse.   Sexual activity: Yes    Partners: Male    Birth control/protection: I.U.D.  Other Topics Concern   Not on file  Social History Narrative   Not on file   Social Drivers of Health   Financial Resource Strain: Low Risk  (03/06/2023)   Overall Financial Resource Strain (CARDIA)    Difficulty of Paying Living Expenses: Not very hard  Food Insecurity: No Food Insecurity (03/06/2023)   Hunger Vital Sign    Worried About Running Out of Food in the Last Year: Never true    Ran Out of Food in the Last Year: Never true  Transportation Needs: No Transportation Needs (03/06/2023)   PRAPARE - Administrator, Civil Service (Medical): No    Lack of Transportation (Non-Medical): No  Physical Activity: Inactive (03/06/2023)   Exercise Vital Sign    Days of Exercise per Week: 0 days    Minutes of Exercise per Session: 0 min  Stress: Stress Concern Present (03/06/2023)   [employer] of Occupational Health - Occupational Stress Questionnaire    Feeling of Stress : To some extent  Social Connections: Socially Isolated (03/06/2023)   Social  Connection and Isolation Panel    Frequency of Communication with Friends and Family: Never    Frequency of Social Gatherings with Friends and Family: Never    Attends Religious Services: Never    Database administrator or Organizations: No    Attends Banker Meetings: Never    Marital Status: Married    Allergies:  Allergies  Allergen Reactions   Penicillins Anaphylaxis    Metabolic Disorder Labs: Lab Results  Component Value Date   HGBA1C 5.4 02/17/2022   No results found for: PROLACTIN Lab Results  Component Value Date   CHOL 160 02/17/2022   TRIG 90 02/17/2022   HDL 41 02/17/2022   LDLCALC 102 (H) 02/17/2022   LDLCALC 102 (H) 02/17/2021   Lab Results  Component Value Date   TSH 2.140 02/24/2023   TSH 2.240 10/07/2022    Therapeutic Level Labs:  No results found for: LITHIUM Lab Results  Component Value Date   VALPROATE 50 08/17/2023   VALPROATE 53 10/24/2022   No results found for: CBMZ  Current Medications: Current Outpatient Medications  Medication Sig Dispense Refill   amphetamine-dextroamphetamine (ADDERALL XR) 30 MG 24 hr capsule Take 30 mg by mouth daily.     b complex vitamins capsule Take 1 capsule by mouth daily.     citalopram  (CELEXA ) 10 MG tablet TAKE 1 AND 1/2 TABLETS BY MOUTH DAILY 135 tablet 1   divalproex  (DEPAKOTE  ER) 500 MG 24 hr tablet Take 2 tablets (1,000 mg total) by mouth daily with supper. 180 tablet 3   levothyroxine  (SYNTHROID ) 75 MCG tablet Take 1 tablet (75 mcg total) by mouth daily. 90 tablet 3   linaclotide  (LINZESS ) 145 MCG CAPS capsule Take 1 capsule (145 mcg total) by mouth daily before breakfast. 90 capsule 2   prazosin  (MINIPRESS ) 2 MG capsule TAKE 2 CAPSULES (4 MG TOTAL) BY MOUTH AT BEDTIME. 180 capsule 1   traZODone  (DESYREL ) 50 MG tablet Take 1.5-2 tablets (75-100 mg total) by mouth at bedtime as needed for sleep. Dose change 180 tablet 0   vitamin B-12 (CYANOCOBALAMIN) 500 MCG tablet Take 500 mcg by  mouth daily.     No current facility-administered medications for this visit.     Musculoskeletal: Strength & Muscle Tone: UTA Gait & Station: Seated Patient leans: N/A  Psychiatric Specialty Exam: Review of Systems  Psychiatric/Behavioral:  Positive for sleep disturbance.        Mood swings    There were no vitals taken for this visit.There is no height or weight on file to calculate BMI.  General Appearance: Casual  Eye Contact:  Fair  Speech:  Clear and Coherent  Volume:  Normal  Mood:  Mood swings  Affect:  Full Range  Thought Process:  Goal Directed and Descriptions of Associations: Intact  Orientation:  Full (Time, Place, and Person)  Thought Content: Logical   Suicidal Thoughts:  No  Homicidal Thoughts:  No  Memory:  Immediate;   Fair Recent;   Fair Remote;   Fair  Judgement:  Fair  Insight:  Fair  Psychomotor Activity:  Normal  Concentration:  Concentration: Fair and Attention Span: Fair  Recall:  [employer] of Knowledge: Good  Language: Fair  Akathisia:  No  Handed:  Right  AIMS (if indicated): not done  Assets:  Communication Skills Desire for Improvement Housing Social Support  ADL's:  Intact  Cognition: WNL  Sleep:  Poor due to having a child who needs help at night   Screenings: GAD-7    Advertising copywriter from 03/06/2023 in [facility] Office Visit from 02/24/2023 in [facility] at [facility] Office Visit from 02/02/2023 in [facility] Office Visit from 12/13/2022 in [facility] Office Visit from 11/01/2022 in [facility]  Total GAD-7 Score 7 2 4 11 15    PHQ2-9    Flowsheet Row Counselor from 03/06/2023 in [facility] Office Visit from 02/24/2023 in [facility] at [facility]  Office Visit from 02/02/2023 in [facility] Office Visit from 12/13/2022 in [facility] Office Visit from 11/01/2022 in [facility]  PHQ-2 Total Score 1 2 2 2 4   PHQ-9 Total  Score 7 3 7 10 19    Flowsheet Row Video Visit from 10/20/2023 in [facility] Video Visit from 08/17/2023 in [facility] Video Visit from 06/14/2023 in [facility]  C-SSRS RISK CATEGORY No Risk No Risk No Risk     Assessment and Plan: Emma Phillips is a 38 year old Caucasian female who has a history of PTSD, episodic mood disorder, history of polysubstance abuse in remission, ADHD was evaluated by telemedicine today.  Discussed assessment and plan as noted below.  1. PTSD (post-traumatic stress disorder)-improving Although with improvement does continue to have irritability and outburst mostly because of lack of sleep.  She does have a child who needs help at night which affects her ability to sleep through the night. Encouraged to start working on sleep hygiene. Increase Trazodone  to 75 to 100 mg at bedtime as needed Continue Prazosin  4 mg at bedtime Continue Celexa  15 mg daily  2. Episodic mood disorder-improving Does have episodic outbursts likely due to sleep problems. Continue Depakote  1000 mg daily with supper (Depakote  level dated 08/17/2023- 50, therapeutic)  3. ADHD (attention deficit hyperactivity disorder), combined type-stable Denies any concerns.   Continue Adderall extended release 30 mg daily prescribed by [place] attention specialist  4. Cocaine use disorder, severe, in sustained remission (HCC) Currently sober  5. Alcohol use disorder, moderate, in sustained remission (HCC) Currently sober  6. Cannabis use disorder in remission Currently sober  7.  History of methamphetamine use Currently sober   Follow-up Follow-up in clinic in 6 to 7 weeks or sooner if needed.  Collaboration of Care: Collaboration of Care: Referral or follow-up with counselor/therapist AEB patient encouraged to establish care with the therapist.  Patient/Guardian was advised Release of Information must be obtained prior to any record release in order to collaborate their care with an outside provider. Patient/Guardian was advised if they have not already done so to contact the registration department to sign all necessary forms in order for us  to release information regarding their care.   Consent: Patient/Guardian gives verbal consent for treatment and assignment of benefits for services provided during this visit. Patient/Guardian expressed understanding and agreed to proceed.   This note was generated in part or whole with voice recognition software. Voice recognition is usually quite accurate but there are transcription errors that can and very often do occur. I apologize for any typographical errors that were not detected and corrected.    Emma Prisk, MD 10/20/2023, 11:47 AM

## 2023-11-10 ENCOUNTER — Telehealth: Payer: Self-pay

## 2023-11-10 MED ORDER — LINACLOTIDE 145 MCG PO CAPS: 145.0000 ug | ORAL_CAPSULE | Freq: Every day | ORAL | 0 refills | Status: DC

## 2023-11-10 NOTE — Telephone Encounter (Signed)
 Office visit scheduled to see [name] for follow up constipation.  Refill for Linzess  145 mcg has been sent for 30 days to pharmacy.  Thanks, [place], CMA

## 2023-11-12 ENCOUNTER — Other Ambulatory Visit: Payer: Self-pay | Admitting: Psychiatry

## 2023-11-12 DIAGNOSIS — F39 Unspecified mood [affective] disorder: Secondary | ICD-10-CM

## 2023-11-28 NOTE — Progress Notes (Unsigned)
 11/29/2023 Clotilda Bloodgood [id number] 1985/07/21  Gastroenterology Office Note     Primary Care Physician:  Lemon Raisin, MD  Primary GI Provider: Jinny Carmine, MD    Chief Complaint   Chief Complaint  Patient presents with   Other    Linzess  has helped with constipation 145 dose     History of Present Illness   Alida Greiner is a 38 y.o. female with PMHX of constipation presenting today for follow up.  Patient last seen by Dr. Jinny on 11/07/2022 for chronic constipation.  Was given samples of Linzess  72, 145, and 290 mcg to try.  Patient reports she has been taking Linzess  145 mcg since seeing Dr. Jinny last year.  She states it works well for her and before starting on it she would only have a bowel movement once every 2 to 3 weeks.  She is now having a soft semiformed bowel movement daily. Reports that sometimes she forgets to take it and she will feel more gassy and bloated, but then when she takes it the next day she is fine.  She has noticed that milk and cheese constipate her so she tries to avoid those. Denies abdominal pain, weight loss, melena or hematochezia.    She has noticed that milk and cheese constipates her so she tried to avoid those.   Past Medical History:  Diagnosis Date   ADHD (attention deficit hyperactivity disorder)    Anxiety    Bipolar 1 disorder (HCC)    Insomnia     Past Surgical History:  Procedure Laterality Date   COLPOSCOPY     Per patient age 61   INTRAUTERINE DEVICE (IUD) INSERTION  2018   TIBIA IM NAIL INSERTION Right 09/03/2021   Procedure: INTRAMEDULLARY (IM) NAIL TIBIAL;  Surgeon: Rollene Cough, MD;  Location: [facility] ORS;  Service: Orthopedics;  Laterality: Right;    Current Outpatient Medications  Medication Sig Dispense Refill   amphetamine-dextroamphetamine (ADDERALL XR) 30 MG 24 hr capsule Take 30 mg by mouth daily.     b complex vitamins capsule Take 1 capsule by mouth daily.     citalopram  (CELEXA ) 10 MG  tablet TAKE 1 AND 1/2 TABLETS BY MOUTH DAILY 135 tablet 1   divalproex  (DEPAKOTE  ER) 500 MG 24 hr tablet Take 2 tablets (1,000 mg total) by mouth daily with supper. 180 tablet 3   levothyroxine  (SYNTHROID ) 75 MCG tablet Take 1 tablet (75 mcg total) by mouth daily. 90 tablet 3   linaclotide  (LINZESS ) 145 MCG CAPS capsule Take 1 capsule (145 mcg total) by mouth daily before breakfast. 30 capsule 0   prazosin  (MINIPRESS ) 2 MG capsule TAKE 2 CAPSULES (4 MG TOTAL) BY MOUTH AT BEDTIME. 180 capsule 1   traZODone  (DESYREL ) 50 MG tablet Take 1.5-2 tablets (75-100 mg total) by mouth at bedtime as needed for sleep. Dose change 180 tablet 0   vitamin B-12 (CYANOCOBALAMIN) 500 MCG tablet Take 500 mcg by mouth daily.     No current facility-administered medications for this visit.    Allergies as of 11/29/2023 - Review Complete 11/29/2023  Allergen Reaction Noted   Penicillins Anaphylaxis 06/14/2019    Family History  Problem Relation Age of Onset   Drug abuse Mother    Alcohol abuse Mother    Bipolar disorder Mother    Cancer Mother    Thyroid  disease Mother    Cancer Father    ADD / ADHD Brother    Heart disease Maternal Grandfather    Cancer Maternal  Grandmother    Breast cancer Paternal Grandmother    Cancer Paternal Grandmother     Social History   Socioeconomic History   Marital status: Married    Spouse name: joey   Number of children: 2   Years of education: Not on file   Highest education level: High school graduate  Occupational History    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   Drug use: Not Currently    Comment:  History of cocaine, alcohol, cannabis abuse currently in remission.  Brief history of methamphetamine abuse.   Sexual activity: Yes    Partners: Male    Birth control/protection: I.U.D.  Other Topics Concern   Not on file  Social History Narrative   Not on file   Social Drivers of Health   Financial Resource Strain: Low Risk  (03/06/2023)   Overall Financial Resource Strain (CARDIA)    Difficulty of Paying Living Expenses: Not very hard  Food Insecurity: No Food Insecurity (03/06/2023)   Hunger Vital Sign    Worried About Running Out of Food in the Last Year: Never true    Ran Out of Food in the Last Year: Never true  Transportation Needs: No Transportation Needs (03/06/2023)   PRAPARE - Administrator, Civil Service (Medical): No    Lack of Transportation (Non-Medical): No  Physical Activity: Inactive (03/06/2023)   Exercise Vital Sign    Days of Exercise per Week: 0 days    Minutes of Exercise per Session: 0 min  Stress: Stress Concern Present (03/06/2023)   [employer] of Occupational Health - Occupational Stress Questionnaire    Feeling of Stress : To some extent  Social Connections: Socially Isolated (03/06/2023)   Social Connection and Isolation Panel    Frequency of Communication with Friends and Family: Never    Frequency of Social Gatherings with Friends and Family: Never    Attends Religious Services: Never    Database Administrator or Organizations: No    Attends Banker Meetings: Never    Marital Status: Married  Catering Manager Violence: Not At Risk (03/06/2023)   Humiliation, Afraid, Rape, and Kick questionnaire    Fear of Current or Ex-Partner: No    Emotionally Abused: No    Physically Abused: No    Sexually Abused: No     RELEVANT GI HISTORY, IMAGING AND LABS: CBC    Component Value Date/Time   WBC 9.1 09/04/2021 0329   RBC 3.98 09/04/2021 0329   HGB 12.3 09/04/2021 0329   HGB 13.7 08/10/2020 0835   HCT 36.3 09/04/2021  0329   HCT 40.1 08/10/2020 0835   PLT 190 08/17/2023 0950   PLT 216 08/10/2020 0835   MCV 91.2 09/04/2021 0329   MCV 95 08/10/2020 0835   MCV 102 (H) 07/21/2013 2042   MCH 30.9 09/04/2021 0329   MCHC 33.9 09/04/2021 0329   RDW 12.3 09/04/2021 0329   RDW 12.7 08/10/2020 0835   RDW 13.6 07/21/2013 2042   LYMPHSABS 1.5 08/10/2020 0835   LYMPHSABS 2.4 07/21/2013 2042  MONOABS 1.0 (H) 07/21/2013 2042   EOSABS 0.1 08/10/2020 0835   EOSABS 0.1 07/21/2013 2042   BASOSABS 0.0 08/10/2020 0835   BASOSABS 0.1 07/21/2013 2042   No results for input(s): HGB in the last 8760 hours.  CMP     Component Value Date/Time   NA 139 08/17/2023 0950   NA 139 08/18/2022 0953   K 4.5 08/18/2022 0953   CL 103 08/18/2022 0953   CO2 21 08/18/2022 0953   GLUCOSE 109 (H) 08/18/2022 0953   GLUCOSE 154 (H) 09/04/2021 0329   BUN 13 08/18/2022 0953   CREATININE 0.71 08/18/2022 0953   CALCIUM 9.4 08/18/2022 0953   PROT 6.7 08/17/2023 0950   PROT 6.8 08/18/2022 0953   ALBUMIN 3.7 08/17/2023 0950   ALBUMIN 4.3 08/18/2022 0953   AST 26 08/17/2023 0950   ALT 21 08/17/2023 0950   ALKPHOS 51 08/17/2023 0950   BILITOT 0.8 08/17/2023 0950   BILITOT 0.3 08/18/2022 0953   GFRNONAA >60 09/04/2021 0329   GFRAA 143 08/07/2015 1546      Latest Ref Rng & Units 08/17/2023    9:50 AM 10/24/2022    8:44 AM 08/18/2022    9:53 AM  Hepatic Function  Total Protein 6.5 - 8.1 g/dL 6.7  7.2  6.8   Albumin 3.5 - 5.0 g/dL 3.7  3.8  4.3   AST 15 - 41 U/L 26  24  22    ALT 0 - 44 U/L 21  23  20    Alk Phosphatase 38 - 126 U/L 51  80  95   Total Bilirubin 0.0 - 1.2 mg/dL 0.8  0.7  0.3   Bilirubin, Direct 0.0 - 0.2 mg/dL <9.8  <9.8        Review of Systems   All systems reviewed and negative except where noted in HPI.    Physical Exam  BP (!) 150/90   Pulse 94   Temp 98.1 F (36.7 C)   Ht 5' 3 (1.6 m)   Wt 203 lb 6.4 oz (92.3 kg)   SpO2 100%   BMI 36.03 kg/m  No LMP recorded. (Menstrual status:  IUD). General:   Alert and oriented. Pleasant and cooperative. Well-nourished and well-developed. In no acute distress. Head:  Normocephalic and atraumatic. Eyes:  Without icterus Ears:  Normal auditory acuity. Neck:  Supple; no masses or thyromegaly. Lungs:  Respirations even and unlabored.  Clear throughout to auscultation.   No wheezes, crackles, or rhonchi. No acute distress. Heart:  Regular rate and rhythm; no murmurs, clicks, rubs, or gallops. Abdomen:  Normal bowel sounds.  No bruits.  Soft, non-tender and non-distended without masses, hepatosplenomegaly or hernias noted.  No guarding or rebound tenderness.  Rectal:  Deferred. Msk:  Symmetrical without gross deformities. Normal posture. Extremities:  Without edema. Neurologic:  Alert and  oriented x4;  grossly normal neurologically. Skin:  Intact without significant lesions or rashes. Psych:  Alert and cooperative. Normal mood and affect.   Assessment & Plan   Myrella Fahs is a 38 y.o. female presenting today for follow up on constipation and medication refill of Linzess  145 mcg. Patient is doing well on Linzess  145 mcg.  She is having daily bowel movements, occasionally may forget to take medication and then she will have a bowel movement the following day after she takes it again. Patient will remain at this dosage and refill has been sent in.  The patient was provided an opportunity to ask questions and all were answered. The  patient agreed with the plan and will follow-up in 1 year or sooner if needed for other GI symptoms or worsening constipation.    [name] Bohr, DNP, AGNP-C [facility]

## 2023-11-29 ENCOUNTER — Ambulatory Visit: Admitting: Family Medicine

## 2023-11-29 ENCOUNTER — Encounter: Payer: Self-pay | Admitting: Family Medicine

## 2023-11-29 VITALS — BP 150/90 | HR 94 | Temp 98.1°F | Ht 63.0 in | Wt 203.4 lb

## 2023-11-29 DIAGNOSIS — K5904 Chronic idiopathic constipation: Secondary | ICD-10-CM

## 2023-11-29 MED ORDER — LINACLOTIDE 145 MCG PO CAPS: 145.0000 ug | ORAL_CAPSULE | Freq: Every day | ORAL | 3 refills | Status: AC

## 2023-12-11 ENCOUNTER — Encounter: Payer: Self-pay | Admitting: Psychiatry

## 2023-12-11 ENCOUNTER — Telehealth: Admitting: Psychiatry

## 2023-12-11 DIAGNOSIS — F902 Attention-deficit hyperactivity disorder, combined type: Secondary | ICD-10-CM | POA: Diagnosis not present

## 2023-12-11 DIAGNOSIS — F4310 Post-traumatic stress disorder, unspecified: Secondary | ICD-10-CM | POA: Diagnosis not present

## 2023-12-11 DIAGNOSIS — F1421 Cocaine dependence, in remission: Secondary | ICD-10-CM | POA: Diagnosis not present

## 2023-12-11 DIAGNOSIS — F39 Unspecified mood [affective] disorder: Secondary | ICD-10-CM | POA: Diagnosis not present

## 2023-12-11 DIAGNOSIS — F1291 Cannabis use, unspecified, in remission: Secondary | ICD-10-CM

## 2023-12-11 DIAGNOSIS — F1591 Other stimulant use, unspecified, in remission: Secondary | ICD-10-CM

## 2023-12-11 DIAGNOSIS — F1021 Alcohol dependence, in remission: Secondary | ICD-10-CM

## 2023-12-11 MED ORDER — PRAZOSIN HCL 2 MG PO CAPS: 4.0000 mg | ORAL_CAPSULE | Freq: Every day | ORAL | 1 refills | Status: AC

## 2023-12-11 NOTE — Progress Notes (Signed)
 Virtual Visit via Video Note  I connected with Emma Phillips on 12/11/23 at  1:00 PM EST by a video enabled telemedicine application and verified that I am speaking with the correct person using two identifiers.  Location Provider Location : [facility] Patient Location : Home  Participants: Patient , Provider    I discussed the limitations of evaluation and management by telemedicine and the availability of in person appointments. The patient expressed understanding and agreed to proceed.   I discussed the assessment and treatment plan with the patient. The patient was provided an opportunity to ask questions and all were answered. The patient agreed with the plan and demonstrated an understanding of the instructions.   The patient was advised to call back or seek an in-person evaluation if the symptoms worsen or if the condition fails to improve as anticipated.   BH MD OP Progress Note  12/11/2023 1:18 PM Lamara Brecht  MRN:  [id number]  Chief Complaint:  Chief Complaint  Patient presents with   Follow-up   Anxiety   Medication Refill   Depression   Discussed the use of AI scribe software for clinical note transcription with the patient, who gave verbal consent to proceed.  History of Present Illness Emma Phillips is a 38 year old Caucasian female, married, stay-at-home mom, lives in [place], has a history of PTSD, episodic mood disorder, attention concentration deficit, cocaine, alcohol, methamphetamine, cannabis use disorder and remission was evaluated by telemedicine today.  She reports stable mood and no new concerns or changes since her last visit. Ongoing situational stress related to family dynamics, particularly around the holidays, continues to affect her, and she notes choosing not to invite her stepfather and his mother due to previous negative interactions and difficulty managing these relationships. She reports that the loss of her mother earlier this year  remains a sensitive topic, and she anticipates that family members may bring it up, which she finds distressing.  She states that she is sleeping well and denies any thoughts of harming herself or others.  Her current medication regimen includes trazodone  75-100 mg at bedtime for sleep, prazosin  4 mg, Celexa  15 mg, Depakote  1000 mg, and Adderall 30 mg prescribed by her attention specialist.  She has been compliant on medications as prescribed and denies side effects.  She denies any other concerns today.   Visit Diagnosis:    ICD-10-CM   1. PTSD (post-traumatic stress disorder)  F43.10 prazosin  (MINIPRESS ) 2 MG capsule    2. Episodic mood disorder  F39    R/O Bipolar type 2    3. ADHD (attention deficit hyperactivity disorder), combined type  F90.2     4. Cocaine use disorder, severe, in sustained remission (HCC)  F14.21     5. Alcohol use disorder, moderate, in sustained remission (HCC)  F10.21     6. Cannabis use disorder in remission  F12.91    mild    7. History of methamphetamine use  F15.91       Past Psychiatric History: I have reviewed past psychiatric history from progress note on 10/17/2022.  Past Medical History:  Past Medical History:  Diagnosis Date   ADHD (attention deficit hyperactivity disorder)    Anxiety    Bipolar 1 disorder (HCC)    Insomnia     Past Surgical History:  Procedure Laterality Date   COLPOSCOPY     Per patient age 38   INTRAUTERINE DEVICE (IUD) INSERTION  2018   TIBIA IM NAIL INSERTION Right 09/03/2021   Procedure:  INTRAMEDULLARY (IM) NAIL TIBIAL;  Surgeon: Rollene Cough, MD;  Location: [facility] ORS;  Service: Orthopedics;  Laterality: Right;    Family Psychiatric History: I have reviewed family psychiatric history from progress note on 10/17/2022.  Family History:  Family History  Problem Relation Age of Onset   Drug abuse Mother    Alcohol abuse Mother    Bipolar disorder Mother    Cancer Mother    Thyroid  disease Mother     Cancer Father    ADD / ADHD Brother    Heart disease Maternal Grandfather    Cancer Maternal Grandmother    Breast cancer Paternal Grandmother    Cancer Paternal Grandmother     Social History: I have reviewed social history from progress note on 10/17/2022. 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   Drug use: Not Currently    Comment: History of cocaine, alcohol, cannabis abuse currently in remission.  Brief history of methamphetamine abuse.   Sexual activity: Yes    Partners: Male    Birth control/protection: I.U.D.  Other Topics Concern   Not on file  Social History Narrative   Not on file   Social Drivers of Health   Financial Resource Strain: Low Risk  (03/06/2023)   Overall Financial Resource Strain (CARDIA)    Difficulty of Paying Living Expenses: Not very hard  Food Insecurity: No Food Insecurity (03/06/2023)   Hunger Vital Sign    Worried About Running Out of Food in the Last Year: Never true    Ran Out of Food in the Last Year: Never true  Transportation Needs: No Transportation Needs (03/06/2023)   PRAPARE - Administrator, Civil Service (Medical): No    Lack of Transportation (Non-Medical): No  Physical Activity: Inactive (03/06/2023)   Exercise Vital Sign    Days of Exercise per Week: 0 days    Minutes of Exercise per Session: 0 min  Stress: Stress Concern Present (03/06/2023)    [employer] of Occupational Health - Occupational Stress Questionnaire    Feeling of Stress : To some extent  Social Connections: Socially Isolated (03/06/2023)   Social Connection and Isolation Panel    Frequency of Communication with Friends and Family: Never    Frequency of Social Gatherings with Friends and Family: Never    Attends Religious Services: Never    Database Administrator or Organizations: No    Attends Banker Meetings: Never    Marital Status: Married    Allergies:  Allergies  Allergen Reactions   Penicillins Anaphylaxis    Metabolic Disorder Labs: Lab Results  Component Value Date   HGBA1C 5.4 02/17/2022   No results found for: PROLACTIN Lab Results  Component Value Date   CHOL 160 02/17/2022   TRIG 90 02/17/2022   HDL 41 02/17/2022  LDLCALC 102 (H) 02/17/2022   LDLCALC 102 (H) 02/17/2021   Lab Results  Component Value Date   TSH 2.140 02/24/2023   TSH 2.240 10/07/2022    Therapeutic Level Labs: No results found for: LITHIUM Lab Results  Component Value Date   VALPROATE 50 08/17/2023   VALPROATE 53 10/24/2022   No results found for: CBMZ  Current Medications: Current Outpatient Medications  Medication Sig Dispense Refill   amphetamine-dextroamphetamine (ADDERALL XR) 30 MG 24 hr capsule Take 30 mg by mouth daily.     b complex vitamins capsule Take 1 capsule by mouth daily.     citalopram  (CELEXA ) 10 MG tablet TAKE 1 AND 1/2 TABLETS BY MOUTH DAILY 135 tablet 1   divalproex  (DEPAKOTE  ER) 500 MG 24 hr tablet Take 2 tablets (1,000 mg total) by mouth daily with supper. 180 tablet 3   levothyroxine  (SYNTHROID ) 75 MCG tablet Take 1 tablet (75 mcg total) by mouth daily. 90 tablet 3   linaclotide  (LINZESS ) 145 MCG CAPS capsule Take 1 capsule (145 mcg total) by mouth daily before breakfast. 90 capsule 3   prazosin  (MINIPRESS ) 2 MG capsule Take 2 capsules (4 mg total) by mouth at bedtime. 180 capsule 1   traZODone  (DESYREL ) 50  MG tablet Take 1.5-2 tablets (75-100 mg total) by mouth at bedtime as needed for sleep. Dose change 180 tablet 0   vitamin B-12 (CYANOCOBALAMIN) 500 MCG tablet Take 500 mcg by mouth daily.     No current facility-administered medications for this visit.     Musculoskeletal: Strength & Muscle Tone: UTA Gait & Station: Seated Patient leans: N/A  Psychiatric Specialty Exam: Review of Systems  Psychiatric/Behavioral: Negative.         Situational mood swings    There were no vitals taken for this visit.There is no height or weight on file to calculate BMI.  General Appearance: Fairly Groomed  Eye Contact:  Good  Speech:  Clear and Coherent  Volume:  Normal  Mood:  mood swings - improving  Affect:  Congruent  Thought Process:  Goal Directed and Descriptions of Associations: Intact  Orientation:  Full (Time, Place, and Person)  Thought Content: Logical   Suicidal Thoughts:  No  Homicidal Thoughts:  No  Memory:  Immediate;   Fair Recent;   Fair Remote;   Fair  Judgement:  Fair  Insight:  Fair  Psychomotor Activity:  Normal  Concentration:  Concentration: Fair and Attention Span: Fair  Recall:  [employer] of Knowledge: Fair  Language: Fair  Akathisia:  No  Handed:  Right  AIMS (if indicated): not done  Assets:  Manufacturing Systems Engineer Desire for Improvement Housing Social Support Transportation  ADL's:  Intact  Cognition: WNL  Sleep:  Fair   Screenings: GAD-7    Advertising Copywriter from 03/06/2023 in [facility] Office Visit from 02/24/2023 in [facility] at [facility] Office Visit from 02/02/2023 in [facility] Office Visit from 12/13/2022 in [facility] Office Visit from 11/01/2022 in [facility]  Total GAD-7 Score 7 2 4 11 15    PHQ2-9    Flowsheet Row Counselor from  03/06/2023 in [facility] Office Visit from 02/24/2023 in [facility] at [facility] Office Visit from 02/02/2023 in [facility] Office Visit from 12/13/2022 in [facility] Office Visit from 11/01/2022 in [facility]  PHQ-2 Total Score 1 2 2 2 4   PHQ-9 Total Score 7 3 7 10 19    Flowsheet Row Video Visit from 12/11/2023 in [facility] Video Visit from 10/20/2023 in [facility] Video Visit from 08/17/2023 in [facility]  C-SSRS RISK CATEGORY No Risk No Risk No Risk     Assessment and Plan: Bridgett Hattabaugh is a 38 year old Caucasian female who has a history of PTSD, episodic mood disorder, history of polysubstance abuse in remission, ADHD was evaluated by telemedicine today.  Discussed assessment and plan as noted below.  1. PTSD (post-traumatic stress disorder)-improving Currently reports overall mood symptoms is improved although occasional intrusive memories due to certain triggers. Encouraged to establish care with therapist, she agrees to get in touch with Ms. Alan Hail. Continue Prazosin  4 mg at bedtime Continue Celexa  15 mg daily Continue Trazodone  75 to 100 mg at bedtime as needed  2. Episodic mood disorder-improving Currently denies any significant mood lability/irritability. Continue Depakote  1000 mg daily with supper (Depakote  level 08/17/2023-50, therapeutic)  3. ADHD (attention deficit hyperactivity disorder), combined type-stable Currently denies any concerns Continue Adderall extended release 30 mg daily prescribed by [place] attention specialist  4. Cocaine use disorder, severe, in sustained remission (HCC) Currently sober  5. Alcohol use disorder, moderate,  in sustained remission (HCC) Currently sober  6. Cannabis use disorder in remission Currently sober  7. History of methamphetamine use Currently sober  Follow-up Follow-up in clinic in 2 months or sooner in person.    Collaboration of Care: Collaboration of Care: Referral or follow-up with counselor/therapist AEB encouraged to establish care with therapist, patient agrees to get in touch with Ms. Alan Lunger, previous therapist.  Patient/Guardian was advised Release of Information must be obtained prior to any record release in order to collaborate their care with an outside provider. Patient/Guardian was advised if they have not already done so to contact the registration department to sign all necessary forms in order for us  to release information regarding their care.   Consent: Patient/Guardian gives verbal consent for treatment and assignment of benefits for services provided during this visit. Patient/Guardian expressed understanding and agreed to proceed.  This note was generated in part or whole with voice recognition software. Voice recognition is usually quite accurate but there are transcription errors that can and very often do occur. I apologize for any typographical errors that were not detected and corrected.     Nimai Burbach, MD 12/11/2023, 2:50 PM

## 2024-02-04 ENCOUNTER — Other Ambulatory Visit: Payer: Self-pay | Admitting: Psychiatry

## 2024-02-04 DIAGNOSIS — F4310 Post-traumatic stress disorder, unspecified: Secondary | ICD-10-CM

## 2024-02-15 ENCOUNTER — Other Ambulatory Visit: Payer: Self-pay

## 2024-02-15 ENCOUNTER — Ambulatory Visit (INDEPENDENT_AMBULATORY_CARE_PROVIDER_SITE_OTHER): Admitting: Psychiatry

## 2024-02-15 ENCOUNTER — Encounter: Payer: Self-pay | Admitting: Psychiatry

## 2024-02-15 VITALS — BP 131/85 | HR 83 | Temp 97.6°F | Ht 63.0 in | Wt 199.0 lb

## 2024-02-15 DIAGNOSIS — Z79899 Other long term (current) drug therapy: Secondary | ICD-10-CM

## 2024-02-15 DIAGNOSIS — F1421 Cocaine dependence, in remission: Secondary | ICD-10-CM | POA: Diagnosis not present

## 2024-02-15 DIAGNOSIS — F4310 Post-traumatic stress disorder, unspecified: Secondary | ICD-10-CM | POA: Diagnosis not present

## 2024-02-15 DIAGNOSIS — F3181 Bipolar II disorder: Secondary | ICD-10-CM

## 2024-02-15 DIAGNOSIS — F1021 Alcohol dependence, in remission: Secondary | ICD-10-CM | POA: Diagnosis not present

## 2024-02-15 DIAGNOSIS — F1591 Other stimulant use, unspecified, in remission: Secondary | ICD-10-CM

## 2024-02-15 DIAGNOSIS — F1291 Cannabis use, unspecified, in remission: Secondary | ICD-10-CM

## 2024-02-15 DIAGNOSIS — F902 Attention-deficit hyperactivity disorder, combined type: Secondary | ICD-10-CM | POA: Diagnosis not present

## 2024-02-15 MED ORDER — TRAZODONE HCL 50 MG PO TABS: 75.0000 mg | ORAL_TABLET | Freq: Every evening | ORAL | 0 refills | Status: AC | PRN

## 2024-02-15 MED ORDER — DIVALPROEX SODIUM ER 250 MG PO TB24: 250.0000 mg | ORAL_TABLET | Freq: Every day | ORAL | 1 refills | Status: AC

## 2024-02-15 NOTE — Progress Notes (Signed)
 [facility] MD OP Progress Note  02/15/2024 11:00 AM Emma Phillips  MRN:  [id number]  Chief Complaint:  Chief Complaint  Patient presents with   Follow-up   Depression   Anxiety   Medication Refill  Discussed the use of AI scribe software for clinical note transcription with the patient, who gave verbal consent to proceed.  History of Present Illness Emma Phillips is a 39 year old Caucasian female, married, stay-at-home mom, lives in [place], has a history of PTSD, episodic mood disorder, attention concentration deficit, cocaine, alcohol, methamphetamine, cannabis use disorder in remission was evaluated in office today for a follow-up appointment.  Over the past several weeks, she describes experiencing increased irritability and anger, particularly in response to family dynamics and household responsibilities. Frustration arises for her when her children do not participate in chores and her husband's behavior contributes to her feeling overwhelmed. She notes feeling snappy and easily agitated, especially when family members do not help with household tasks or create additional work. She states that gaining control over her anger would improve her well-being and reports that behavioral and situational factors contribute to her current distress.  Disrupted sleep affects her, and she reports that her children being out of school and changes in household routine result in this. She does not report experiencing nightmares.  She currently takes Depakote  1000 mg in the evening and continues to be compliant on trazodone , prazosin , Celexa .  She is also on Adderall prescribed by [place] attention specialist.  She continues to follow-up with her therapist and is motivated to stay in therapy.  She currently denies any suicidality, homicidality or perceptual disturbances.  She engages in sewing as a hobby and previously did paramedic.      Visit Diagnosis:    ICD-10-CM   1. PTSD  (post-traumatic stress disorder)  F43.10 traZODone  (DESYREL ) 50 MG tablet    2. Mild mixed bipolar II disorder (HCC)  F31.81 divalproex  (DEPAKOTE  ER) 250 MG 24 hr tablet    3. ADHD (attention deficit hyperactivity disorder), combined type  F90.2     4. Cocaine use disorder, severe, in sustained remission (HCC)  F14.21     5. Alcohol use disorder, moderate, in sustained remission (HCC)  F10.21     6. Cannabis use disorder in remission  F12.91    mild    7. History of methamphetamine use  F15.91     8. High risk medication use  Z79.899 Valproic acid  level    Sodium    Hepatic function panel    Platelet count      Past Psychiatric History: I have reviewed past psychiatric history from progress note on 10/17/2022.  Past Medical History:  Past Medical History:  Diagnosis Date   ADHD (attention deficit hyperactivity disorder)    Anxiety    Bipolar 1 disorder (HCC)    Insomnia     Past Surgical History:  Procedure Laterality Date   COLPOSCOPY     Per patient age 56   INTRAUTERINE DEVICE (IUD) INSERTION  2018   TIBIA IM NAIL INSERTION Right 09/03/2021   Procedure: INTRAMEDULLARY (IM) NAIL TIBIAL;  Surgeon: Rollene Cough, MD;  Location: [facility] ORS;  Service: Orthopedics;  Laterality: Right;    Family Psychiatric History: Reviewed family psychiatric history from progress note on 10/17/2022.  Family History:  Family History  Problem Relation Age of Onset   Drug abuse Mother    Alcohol abuse Mother    Bipolar disorder Mother    Cancer Mother    Thyroid  disease  Mother    Cancer Father    ADD / ADHD Brother    Heart disease Maternal Grandfather    Cancer Maternal Grandmother    Breast cancer Paternal Grandmother    Cancer Paternal Grandmother     Social History: I have reviewed social history from progress note on 10/17/2022. 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   Drug use: Not Currently    Comment: History of cocaine, alcohol, cannabis abuse currently in remission.  Brief history of methamphetamine abuse.   Sexual activity: Yes    Partners: Male    Birth control/protection: I.U.D.  Other Topics Concern   Not on file  Social History Narrative   Not on file   Social Drivers of Health   Tobacco Use: Medium Risk (02/15/2024)   Patient History    Smoking Tobacco Use: Former    Smokeless Tobacco Use: Never    Passive Exposure: Past  Physicist, Medical Strain: Low Risk (03/06/2023)   Overall Financial Resource Strain (CARDIA)    Difficulty of Paying Living Expenses: Not very hard  Food Insecurity: No Food Insecurity (03/06/2023)   Hunger Vital Sign    Worried About Running Out of Food in the Last Year: Never true    Ran Out of Food in the Last Year: Never true  Transportation Needs: No Transportation Needs (03/06/2023)   PRAPARE - Administrator, Civil Service (Medical): No    Lack of Transportation (Non-Medical): No  Physical Activity: Inactive (03/06/2023)   Exercise Vital Sign    Days of Exercise per Week: 0 days    Minutes of Exercise per Session: 0 min  Stress: Stress Concern Present (03/06/2023)   [employer] of Occupational Health - Occupational Stress Questionnaire    Feeling of Stress : To some extent  Social Connections: Socially Isolated (03/06/2023)   Social Connection and Isolation Panel    Frequency of Communication with Friends and Family: Never     Frequency of Social Gatherings with Friends and Family: Never    Attends Religious Services: Never    Database Administrator or Organizations: No    Attends Banker Meetings: Never    Marital Status: Married  Depression (PHQ2-9): Low Risk (02/15/2024)   Depression (PHQ2-9)    PHQ-2 Score: 1  Alcohol Screen: Low Risk (03/06/2023)   Alcohol Screen    Last Alcohol Screening Score (AUDIT): 0  Housing: Low Risk (03/06/2023)   Housing Stability Vital Sign    Unable to Pay for Housing in the Last Year: No    Number of Times Moved in the Last Year: 0    Homeless in the Last Year: No  Utilities: Not At Risk (03/06/2023)   AHC Utilities    Threatened with loss of utilities: No  Health Literacy: Inadequate Health Literacy (  03/06/2023)   B1300 Health Literacy    Frequency of need for help with medical instructions: Sometimes    Allergies: Allergies[1]  Metabolic Disorder Labs: Lab Results  Component Value Date   HGBA1C 5.4 02/17/2022   No results found for: PROLACTIN Lab Results  Component Value Date   CHOL 160 02/17/2022   TRIG 90 02/17/2022   HDL 41 02/17/2022   LDLCALC 102 (H) 02/17/2022   LDLCALC 102 (H) 02/17/2021   Lab Results  Component Value Date   TSH 2.140 02/24/2023   TSH 2.240 10/07/2022    Therapeutic Level Labs: No results found for: LITHIUM Lab Results  Component Value Date   VALPROATE 50 08/17/2023   VALPROATE 53 10/24/2022   No results found for: CBMZ  Current Medications: Current Outpatient Medications  Medication Sig Dispense Refill   divalproex  (DEPAKOTE  ER) 250 MG 24 hr tablet Take 1 tablet (250 mg total) by mouth daily. Take daily along with 1000 mg 30 tablet 1   amphetamine-dextroamphetamine (ADDERALL XR) 30 MG 24 hr capsule Take 30 mg by mouth daily.     b complex vitamins capsule Take 1 capsule by mouth daily.     citalopram  (CELEXA ) 10 MG tablet TAKE 1 AND 1/2 TABLETS BY MOUTH DAILY 135 tablet 1   divalproex  (DEPAKOTE  ER) 500  MG 24 hr tablet Take 2 tablets (1,000 mg total) by mouth daily with supper. 180 tablet 3   levothyroxine  (SYNTHROID ) 75 MCG tablet Take 1 tablet (75 mcg total) by mouth daily. 90 tablet 3   linaclotide  (LINZESS ) 145 MCG CAPS capsule Take 1 capsule (145 mcg total) by mouth daily before breakfast. 90 capsule 3   prazosin  (MINIPRESS ) 2 MG capsule Take 2 capsules (4 mg total) by mouth at bedtime. 180 capsule 1   traZODone  (DESYREL ) 50 MG tablet Take 1.5-2 tablets (75-100 mg total) by mouth at bedtime as needed for sleep. Dose change 180 tablet 0   vitamin B-12 (CYANOCOBALAMIN) 500 MCG tablet Take 500 mcg by mouth daily.     No current facility-administered medications for this visit.     Musculoskeletal: Strength & Muscle Tone: within normal limits Gait & Station: normal Patient leans: N/A  Psychiatric Specialty Exam: Review of Systems  Psychiatric/Behavioral:         Irritable    Blood pressure 131/85, pulse 83, temperature 97.6 F (36.4 C), temperature source Temporal, height 5' 3 (1.6 m), weight 199 lb (90.3 kg).Body mass index is 35.25 kg/m.  General Appearance: Casual  Eye Contact:  Fair  Speech:  Clear and Coherent  Volume:  Normal  Mood:  Irritable  Affect:  Appropriate  Thought Process:  Goal Directed and Descriptions of Associations: Intact  Orientation:  Full (Time, Place, and Person)  Thought Content: Logical   Suicidal Thoughts:  No  Homicidal Thoughts:  No  Memory:  Immediate;   Fair Recent;   Fair Remote;   Fair  Judgement:  Fair  Insight:  Fair  Psychomotor Activity:  Normal  Concentration:  Concentration: Fair and Attention Span: Fair  Recall:  [employer] of Knowledge: Fair  Language: Fair  Akathisia:  No  Handed:  Right  AIMS (if indicated): not done  Assets:  Communication Skills Desire for Improvement Housing Social Support Talents/Skills Transportation  ADL's:  Intact  Cognition: WNL  Sleep:  Fair   Screenings: GAD-7    Interior And Spatial Designer Visit from 02/15/2024 in [facility] Counselor from 03/06/2023 in [facility] Office Visit from 02/24/2023 in [facility] at [facility] Office Visit from 02/02/2023 in [facility] Office Visit from 12/13/2022 in [facility]  Total GAD-7 Score 4 7 2 4 11    PHQ2-9    Flowsheet Row Office Visit from 02/15/2024 in [facility] Counselor from 03/06/2023 in [facility] Office Visit from 02/24/2023 in [facility] at [facility] Office Visit from 02/02/2023 in [facility] Office Visit from 12/13/2022 in [facility]  PHQ-2 Total Score 1 1 2 2 2   PHQ-9 Total Score -- 7 3 7 10    Flowsheet Row Office Visit from 02/15/2024 in [facility] Video Visit from 12/11/2023 in [facility] Video Visit from 10/20/2023 in [facility]  C-SSRS RISK CATEGORY No Risk No Risk No Risk     Assessment and Plan: Ivianna Notch is a 39 year old Caucasian female who presented for a follow-up appointment, discussed assessment and plan as noted below.  1. PTSD (post-traumatic stress disorder)-improving Only denies any significant intrusive memories flashbacks or nightmares. Continue Celexa  15 mg daily Continue Trazodone  75 to 100 mg at bedtime as needed Continue Prazosin  4 mg at bedtime Continue psychotherapy sessions with Ms. Alan Hail.  2. Mild mixed bipolar II disorder (HCC)-unstable On going irritability, anger issues mood swings.  Agreeable to dosage increase of Depakote  Increase Depakote  to 1250 mg daily.   Could take the extra 250 mg during the day.(Depakote  level-08/17/2023-50 therapeutic)  3. ADHD (attention deficit hyperactivity disorder), combined type-improving Encouraged to continue follow-up with [facility] attention specialist Continue Adderall extended release 30 mg daily  4. Cocaine use disorder, severe, in sustained remission (HCC) Currently sober  5. Alcohol use disorder, moderate, in sustained remission (HCC) Currently sober sober  6. Cannabis use disorder in remission Currently sober  7. History of methamphetamine use Currently sober  8. High risk medication use Order labs including Depakote  level, sodium, platelet count, LFT.  Patient to go to [facility] lab a week after starting the higher dosage of Depakote .  Follow-up Follow-up in clinic in 6 weeks or sooner if needed.    Collaboration of Care: Collaboration of Care: Referral or follow-up with counselor/therapist AEB patient encouraged to continue CBT  Patient/Guardian was advised Release of Information must be obtained prior to any record release in order to collaborate their care with an outside provider. Patient/Guardian was advised if they have not already done so to contact the registration department to sign all necessary forms in order for us  to release information regarding their care.   Consent: Patient/Guardian gives verbal consent for treatment and assignment of benefits for services provided during this visit. Patient/Guardian expressed understanding and agreed to proceed.  This note was generated in part or whole with voice recognition software. Voice recognition is usually quite accurate but there are transcription errors that can and very often do occur. I apologize for any typographical errors that were not detected and corrected.     Zyrion Coey, MD 02/15/2024, 3:28 PM     [1]  Allergies Allergen Reactions   Penicillins Anaphylaxis

## 2024-03-27 ENCOUNTER — Telehealth: Admitting: Psychiatry
